# Patient Record
Sex: Female | Born: 1950 | Race: White | Hispanic: No | Smoking: Never smoker
Health system: Southern US, Community
[De-identification: ages and names within clinical notes are randomized; demographics above are authoritative.]

## PROBLEM LIST (undated history)

## (undated) DIAGNOSIS — E785 Hyperlipidemia, unspecified: Secondary | ICD-10-CM

## (undated) DIAGNOSIS — E0500 Thyrotoxicosis with diffuse goiter without thyrotoxic crisis or storm: Secondary | ICD-10-CM

## (undated) HISTORY — PX: HIP SURGERY: SHX245

## (undated) HISTORY — PX: BREAST EXCISIONAL BIOPSY: SUR124

## (undated) HISTORY — PX: BREAST BIOPSY: SHX20

## (undated) HISTORY — PX: SHOULDER SURGERY: SHX246

## (undated) HISTORY — DX: Hyperlipidemia, unspecified: E78.5

## (undated) HISTORY — PX: KNEE SURGERY: SHX244

---

## 2017-12-16 DIAGNOSIS — M9903 Segmental and somatic dysfunction of lumbar region: Secondary | ICD-10-CM | POA: Diagnosis not present

## 2017-12-16 DIAGNOSIS — M5136 Other intervertebral disc degeneration, lumbar region: Secondary | ICD-10-CM | POA: Diagnosis not present

## 2017-12-16 DIAGNOSIS — M9901 Segmental and somatic dysfunction of cervical region: Secondary | ICD-10-CM | POA: Diagnosis not present

## 2017-12-16 DIAGNOSIS — M9904 Segmental and somatic dysfunction of sacral region: Secondary | ICD-10-CM | POA: Diagnosis not present

## 2017-12-30 DIAGNOSIS — M9903 Segmental and somatic dysfunction of lumbar region: Secondary | ICD-10-CM | POA: Diagnosis not present

## 2017-12-30 DIAGNOSIS — M9901 Segmental and somatic dysfunction of cervical region: Secondary | ICD-10-CM | POA: Diagnosis not present

## 2017-12-30 DIAGNOSIS — M9904 Segmental and somatic dysfunction of sacral region: Secondary | ICD-10-CM | POA: Diagnosis not present

## 2017-12-30 DIAGNOSIS — M5136 Other intervertebral disc degeneration, lumbar region: Secondary | ICD-10-CM | POA: Diagnosis not present

## 2018-01-02 DIAGNOSIS — R079 Chest pain, unspecified: Secondary | ICD-10-CM | POA: Diagnosis not present

## 2018-01-02 DIAGNOSIS — F988 Other specified behavioral and emotional disorders with onset usually occurring in childhood and adolescence: Secondary | ICD-10-CM | POA: Diagnosis not present

## 2018-01-02 DIAGNOSIS — E039 Hypothyroidism, unspecified: Secondary | ICD-10-CM | POA: Diagnosis not present

## 2018-01-02 DIAGNOSIS — F419 Anxiety disorder, unspecified: Secondary | ICD-10-CM | POA: Diagnosis not present

## 2018-01-06 DIAGNOSIS — M5136 Other intervertebral disc degeneration, lumbar region: Secondary | ICD-10-CM | POA: Diagnosis not present

## 2018-01-06 DIAGNOSIS — M9904 Segmental and somatic dysfunction of sacral region: Secondary | ICD-10-CM | POA: Diagnosis not present

## 2018-01-06 DIAGNOSIS — M9901 Segmental and somatic dysfunction of cervical region: Secondary | ICD-10-CM | POA: Diagnosis not present

## 2018-01-06 DIAGNOSIS — M9903 Segmental and somatic dysfunction of lumbar region: Secondary | ICD-10-CM | POA: Diagnosis not present

## 2018-01-14 ENCOUNTER — Emergency Department (HOSPITAL_COMMUNITY)
Admission: EM | Admit: 2018-01-14 | Discharge: 2018-01-14 | Disposition: A | Discharge status: Home / Self Care | Payer: Medicare Other | Attending: Emergency Medicine | Admitting: Emergency Medicine

## 2018-01-14 ENCOUNTER — Encounter: Payer: Self-pay | Admitting: Emergency Medicine

## 2018-01-14 ENCOUNTER — Emergency Department (HOSPITAL_COMMUNITY): Payer: Medicare Other

## 2018-01-14 DIAGNOSIS — R079 Chest pain, unspecified: Secondary | ICD-10-CM | POA: Diagnosis not present

## 2018-01-14 DIAGNOSIS — R072 Precordial pain: Secondary | ICD-10-CM | POA: Diagnosis not present

## 2018-01-14 DIAGNOSIS — R0602 Shortness of breath: Secondary | ICD-10-CM | POA: Diagnosis not present

## 2018-01-14 HISTORY — DX: Thyrotoxicosis with diffuse goiter without thyrotoxic crisis or storm: E05.00

## 2018-01-14 LAB — COMPREHENSIVE METABOLIC PANEL
ALBUMIN: 3.9 g/dL (ref 3.5–5.0)
ALK PHOS: 53 U/L (ref 38–126)
ALT: 25 U/L (ref 0–44)
ANION GAP: 6 (ref 5–15)
AST: 28 U/L (ref 15–41)
BILIRUBIN TOTAL: 0.8 mg/dL (ref 0.3–1.2)
BUN: 10 mg/dL (ref 8–23)
CALCIUM: 9 mg/dL (ref 8.9–10.3)
CO2: 29 mmol/L (ref 22–32)
Chloride: 106 mmol/L (ref 98–111)
Creatinine, Ser: 0.89 mg/dL (ref 0.44–1.00)
GFR calc Af Amer: >60 mL/min (ref >60)
GFR calc non Af Amer: >60 mL/min (ref >60)
GLUCOSE: 101 mg/dL — AB (ref 70–99)
POTASSIUM: 3.9 mmol/L (ref 3.5–5.1)
SODIUM: 141 mmol/L (ref 135–145)
TOTAL PROTEIN: 6.7 g/dL (ref 6.5–8.1)

## 2018-01-14 LAB — CBC
HEMATOCRIT: 41.5 % (ref 36.0–46.0)
HEMOGLOBIN: 13.9 g/dL (ref 12.0–15.0)
MCH: 30.5 pg (ref 26.0–34.0)
MCHC: 33.5 g/dL (ref 30.0–36.0)
MCV: 91.2 fL (ref 78.0–100.0)
Platelets: 225 10*3/uL (ref 150–400)
RBC: 4.55 MIL/uL (ref 3.87–5.11)
RDW: 12.5 % (ref 11.5–15.5)
WBC: 3.9 10*3/uL — ABNORMAL LOW (ref 4.0–10.5)

## 2018-01-14 LAB — I-STAT TROPONIN, ED
TROPONIN I, POC: 0.01 ng/mL (ref 0.00–0.08)
TROPONIN I, POC: 0.01 ng/mL (ref 0.00–0.08)

## 2018-01-14 LAB — D-DIMER, QUANTITATIVE (NOT AT ARMC): D DIMER QUANT: 0.63 ug{FEU}/mL — AB (ref 0.00–0.50)

## 2018-01-14 MED ORDER — NITROGLYCERIN 0.4 MG SL SUBL
0.4000 mg | SUBLINGUAL_TABLET | SUBLINGUAL | Status: DC | PRN
  Filled 2018-01-14: qty 1

## 2018-01-14 MED ORDER — SODIUM CHLORIDE 0.9 % IV SOLN
INTRAVENOUS | Status: DC
  Administered 2018-01-14: 11:00:00 via INTRAVENOUS

## 2018-01-14 MED ORDER — ASPIRIN 81 MG PO CHEW
324.0000 mg | CHEWABLE_TABLET | Freq: Once | ORAL | Status: AC
  Administered 2018-01-14: 324 mg via ORAL
  Filled 2018-01-14: qty 4

## 2018-01-14 MED ORDER — ACETAMINOPHEN 325 MG PO TABS
650.0000 mg | ORAL_TABLET | Freq: Once | ORAL | Status: AC
  Administered 2018-01-14: 650 mg via ORAL
  Filled 2018-01-14: qty 2

## 2018-01-14 NOTE — ED Provider Notes (Signed)
[facility]-EMERGENCY DEPT Provider Note   CSN: [id number] Arrival date & time: 01/14/18  4098     History   Chief Complaint Chief Complaint  Patient presents with  . Chest Pain    HPI Lydia Osborne is a 67 y.o. female medical history of Graves' disease, who presents today for evaluation of chest pain and shortness of breath.  She reports that her symptoms have been intermittent for about the past month, however they have been getting closer and closer together and gradually worsening.  She denies any fevers or chills.  She reports that her chest pain is primarily in the lower substernal/left-sided chest.  She has not found anything that makes her pain better or worse, denies any coughs or fevers.  She denies diaphoresis, nausea or vomiting or abdominal pain.  No need to sleep on increased pillows or worsening dyspnea with exertion.  She reports that she had a stress test about 3-5 years ago, has never had a cardiac cath.   HPI  Past Medical History:  Diagnosis Date  . Graves' disease in remission     There are no active problems to display for this patient.   History reviewed. No pertinent surgical history.   OB History   None      Home Medications    Prior to Admission medications   Medication Sig Start Date End Date Taking? Authorizing Provider  levothyroxine (SYNTHROID, LEVOTHROID) 125 MCG tablet Take 125 mcg by mouth daily. 01/02/18  Yes [provider]  LORazepam (ATIVAN) 0.5 MG tablet Take 0.5 mg by mouth daily as needed for anxiety. 01/02/18  Yes [provider]    Family History History reviewed. No pertinent family history.  Social History Social History   Tobacco Use  . Smoking status: Never Smoker  . Smokeless tobacco: Never Used  Substance Use Topics  . Alcohol use: Not on file  . Drug use: Not on file     Allergies   Sulfa antibiotics   Review of Systems Review of Systems  Constitutional: Negative for  chills and fever.  Respiratory: Positive for shortness of breath. Negative for cough and chest tightness.   Cardiovascular: Positive for chest pain.  Gastrointestinal: Negative for abdominal pain, diarrhea, nausea and vomiting.  Neurological: Negative for dizziness.  All other systems reviewed and are negative.    Physical Exam Updated Vital Signs BP 119/68   Pulse (!) 53   Temp 98.5 F (36.9 C) (Oral)   Resp 19   SpO2 91%   Physical Exam  Constitutional: She is oriented to person, place, and time. She appears well-developed and well-nourished.  Non-toxic appearance. No distress.  HENT:  Head: Normocephalic and atraumatic.  Eyes: Conjunctivae are normal.  Neck: Normal range of motion. Neck supple.  Cardiovascular: Normal rate, regular rhythm and normal pulses.  No murmur heard. Pulses:      Dorsalis pedis pulses are 2+ on the right side, and 2+ on the left side.       Posterior tibial pulses are 2+ on the right side, and 2+ on the left side.  Pulmonary/Chest: Effort normal and breath sounds normal. No respiratory distress. She has no decreased breath sounds. She has no wheezes. She has no rales.  Unable to recreate chest pain with palpation.   Abdominal: Soft. Bowel sounds are normal. She exhibits no distension. There is no tenderness. There is no guarding.  Musculoskeletal: She exhibits no edema.       Right lower leg: Normal.  She exhibits no tenderness and no edema.       Left lower leg: Normal. She exhibits no tenderness and no edema.  Neurological: She is alert and oriented to person, place, and time.  Skin: Skin is warm and dry.  Psychiatric: She has a normal mood and affect.  Nursing note and vitals reviewed.    ED Treatments / Results  Labs (all labs ordered are listed, but only abnormal results are displayed) Labs Reviewed  COMPREHENSIVE METABOLIC PANEL - Abnormal; Notable for the following components:      Result Value   Glucose, Bld 101 (*)    All other  components within normal limits  CBC - Abnormal; Notable for the following components:   WBC 3.9 (*)    All other components within normal limits  D-DIMER, QUANTITATIVE (NOT [facility]) - Abnormal; Notable for the following components:   D-Dimer, Quant 0.63 (*)    All other components within normal limits  I-STAT TROPONIN, ED  I-STAT TROPONIN, ED    EKG EKG Interpretation  Date/Time:  Saturday January 14 2018 09:17:32 EDT Ventricular Rate:  69 PR Interval:    QRS Duration: 88 QT Interval:  367 QTC Calculation: 394 R Axis:   49 Text Interpretation:  Sinus rhythm Low voltage, precordial leads Borderline repolarization abnormality No old tracing to compare Confirmed by Linwood DibblesKnapp, Jon [phone]([id number]) on 01/14/2018 9:21:01 AM Also confirmed by Linwood DibblesKnapp, Jon [phone]), editor Barbette Hairassel, Kerry [phone])  on 01/14/2018 10:59:12 AM   Radiology Dg Chest 2 View  Result Date: 01/14/2018 CLINICAL DATA:  Chest pain and shortness of breath EXAM: CHEST - 2 VIEW COMPARISON:  None FINDINGS: Cardiac shadow is within normal limits. The lungs are well aerated bilaterally. A rounded density is noted over the right lung base. This is not well appreciated on the lateral projection. Noncontrast CT would be helpful further evaluation. No infiltrate or effusion is seen. No bony abnormality is noted. IMPRESSION: Vague nodular density as described on the right not well appreciated on the lateral projection. This may be related to the rib end. Nonemergent chest CT is recommended for further evaluation. Electronically Signed   By: Alcide CleverMark  Lukens M.D.   On: 01/14/2018 10:00    Procedures Procedures (including critical care time)  Medications Ordered in ED Medications  nitroGLYCERIN (NITROSTAT) SL tablet 0.4 mg (has no administration in time range)  0.9 %  sodium chloride infusion ( Intravenous New Bag/Given 01/14/18 1058)  aspirin chewable tablet 324 mg (324 mg Oral Given 01/14/18 0935)  acetaminophen (TYLENOL) tablet 650 mg (650 mg Oral  Given 01/14/18 1059)     Initial Impression / Assessment and Plan / ED Course  I have reviewed the triage vital signs and the nursing notes.  Pertinent labs & imaging results that were available during my care of the patient were reviewed by me and considered in my medical decision making (see chart for details).  Clinical Course as of Jan 15 1544  Sat Jan 14, 2018  1030 Reevaluated, chest pain remains a 4 out of 10.  Will give sublingual nitro.   [EH]  1138 When adjusted for age is normal.   D-Dimer, Quant(!): 0.63 [EH]  [id number] Spoke with cardiology who feels can be discharged.    [EH]    Clinical Course User Index [EH] Cristina GongHammond, Peyton Rossner W, PA-C   Patient is to be discharged with recommendation to follow up with PCP in regards to today's hospital visit.  Patient's heart score is 3 making her low risk.  EKG and troponin x2 without evidence of ACS.  Chest x-ray does not show any abnormalities or consolidation.  Patient's chest pain resolved in the emergency room without specific treatment.  After d-dimer was mildly elevated at 0.63, however when adjusted for age this is not elevated.  Heart with regular rate rhythm, breath sounds equal bilaterally.  He did touch base with cardiology as patient's history sounds concerning for an unstable anginal type picture, they recommended outpatient follow-up and states that they will get their office to contact patient for sooner appointment.  This patient was seen as a shared visit with Dr. Lynelle DoctorKnapp.  At the time of discharge patient was symptom free.   Return precautions were discussed with patient who states their understanding.  At the time of discharge patient denied any unaddressed complaints or concerns.  Patient is agreeable for discharge home.   Final Clinical Impressions(s) / ED Diagnoses   Final diagnoses:  Chest pain, unspecified type    ED Discharge Orders    None       Norman ClayHammond, Yailene Badia W, PA-C 01/14/18 1545    Linwood DibblesKnapp, Jon,  MD 01/15/18 [phone]

## 2018-01-14 NOTE — ED Provider Notes (Signed)
Medical screening examination/treatment/procedure(s) were conducted as a shared visit with non-physician practitioner(s) and myself.  I personally evaluated the patient during the encounter.  EKG Interpretation  Date/Time:  Saturday January 14 2018 09:17:32 EDT Ventricular Rate:  69 PR Interval:    QRS Duration: 88 QT Interval:  367 QTC Calculation: 394 R Axis:   49 Text Interpretation:  Sinus rhythm Low voltage, precordial leads Borderline repolarization abnormality No old tracing to compare Confirmed by Linwood DibblesKnapp, Jaevian Shean [phone]) on 01/14/2018 9:21:01 AM Also confirmed by Linwood DibblesKnapp, Norleen Xie [phone]), editor Barbette Hairassel, Kerry [phone])  on 01/14/2018 10:59:12 AM   Patient presented to the emergency room for evaluation of chest pain.  Patient states she has been having symptoms off and on for a few weeks.  She has an outpatient referral to a cardiologist.  Patient states that the discomfort in the center of her chest that radiates to her back.  She states she is very active and has not had any chest pain associated with exertion or activity.  The symptoms just seem to occur randomly.  She is not having any leg swelling.  She denies any nausea vomiting or esophageal reflux symptoms.  No definitive abnormalities on EKG.  Labs are normal.  Plan on delta troponin will add on a d-dimer.   Heart score of 3.   Linwood DibblesKnapp, Shandy Checo, MD 01/14/18 1113

## 2018-01-14 NOTE — ED Notes (Signed)
3x Nitro Sublingual tablets placed at patient bedside for chest pain.

## 2018-01-14 NOTE — ED Triage Notes (Signed)
Pt complains of chest pain and shortness of breath since last night. Pt saw PCP for chest pain last week, had EKG performed and was referred to cardiologist, which she sees next month. Pt denies cough, states pain is worse when lying down.

## 2018-01-14 NOTE — ED Notes (Signed)
ED Provider at bedside. 

## 2018-01-14 NOTE — Discharge Instructions (Addendum)
If your chest pain returns, you develop shortness of breath, or any new or concerning symptoms please seek additional medical care and evaluation.

## 2018-01-18 DIAGNOSIS — M9904 Segmental and somatic dysfunction of sacral region: Secondary | ICD-10-CM | POA: Diagnosis not present

## 2018-01-18 DIAGNOSIS — M5136 Other intervertebral disc degeneration, lumbar region: Secondary | ICD-10-CM | POA: Diagnosis not present

## 2018-01-18 DIAGNOSIS — M9903 Segmental and somatic dysfunction of lumbar region: Secondary | ICD-10-CM | POA: Diagnosis not present

## 2018-01-18 DIAGNOSIS — M9901 Segmental and somatic dysfunction of cervical region: Secondary | ICD-10-CM | POA: Diagnosis not present

## 2018-01-20 ENCOUNTER — Ambulatory Visit: Payer: Self-pay | Admitting: Cardiovascular Disease

## 2018-02-06 DIAGNOSIS — M9903 Segmental and somatic dysfunction of lumbar region: Secondary | ICD-10-CM | POA: Diagnosis not present

## 2018-02-06 DIAGNOSIS — M9901 Segmental and somatic dysfunction of cervical region: Secondary | ICD-10-CM | POA: Diagnosis not present

## 2018-02-06 DIAGNOSIS — M9904 Segmental and somatic dysfunction of sacral region: Secondary | ICD-10-CM | POA: Diagnosis not present

## 2018-02-06 DIAGNOSIS — M5136 Other intervertebral disc degeneration, lumbar region: Secondary | ICD-10-CM | POA: Diagnosis not present

## 2018-02-13 DIAGNOSIS — M9903 Segmental and somatic dysfunction of lumbar region: Secondary | ICD-10-CM | POA: Diagnosis not present

## 2018-02-13 DIAGNOSIS — M9904 Segmental and somatic dysfunction of sacral region: Secondary | ICD-10-CM | POA: Diagnosis not present

## 2018-02-13 DIAGNOSIS — M5136 Other intervertebral disc degeneration, lumbar region: Secondary | ICD-10-CM | POA: Diagnosis not present

## 2018-02-13 DIAGNOSIS — M9901 Segmental and somatic dysfunction of cervical region: Secondary | ICD-10-CM | POA: Diagnosis not present

## 2018-02-14 ENCOUNTER — Ambulatory Visit: Payer: Medicare Other | Admitting: Cardiovascular Disease

## 2018-02-14 ENCOUNTER — Encounter: Payer: Self-pay | Admitting: Cardiovascular Disease

## 2018-02-14 DIAGNOSIS — R079 Chest pain, unspecified: Secondary | ICD-10-CM | POA: Diagnosis not present

## 2018-02-14 DIAGNOSIS — R0789 Other chest pain: Secondary | ICD-10-CM | POA: Diagnosis not present

## 2018-02-14 DIAGNOSIS — E785 Hyperlipidemia, unspecified: Secondary | ICD-10-CM | POA: Insufficient documentation

## 2018-02-14 DIAGNOSIS — E7800 Pure hypercholesterolemia, unspecified: Secondary | ICD-10-CM

## 2018-02-14 NOTE — Progress Notes (Signed)
02/14/2018 Lydia Osborne   1950/06/07  [id number]  Primary Physician Koirala, Dibas, MD Primary Cardiologist: Runell Gess MD Lydia Osborne  HPI:  Lydia Osborne is a 67 y.o. moderately overweight married Caucasian female mother of 2, grandmother of 2 grandchildren referred by Dr. Lynelle Doctor in the emergency room for cardiovascular evaluation because of chest pain.  She is retired from working in a group home.  Her risk factors include untreated hyperlipidemia attempting to improve with dietary modification and family history with father who had a myocardial infarction in his 28s.  She is never had a heart attack or stroke.  There is no history of GERD.  She had chest pain the last several months occurring several times a month lasting hours at a time.  Typically epigastric rating to her back without associated symptoms.  Is not brought on by exertion, activity or food.  She was seen in the emergency room on 01/14/2018 with the symptoms ruled out for myocardial infarction.   Current Meds  Medication Sig  . levothyroxine (SYNTHROID, LEVOTHROID) 125 MCG tablet Take 125 mcg by mouth daily.  Marland Kitchen LORazepam (ATIVAN) 0.5 MG tablet Take 0.5 mg by mouth daily as needed for anxiety.     Allergies  Allergen Reactions  . Sulfa Antibiotics     Extended exposure causes itchiness    Social History   Socioeconomic History  . Marital status: Married    Spouse name: Not on file  . Number of children: Not on file  . Years of education: Not on file  . Highest education level: Not on file  Occupational History  . Not on file  Social Needs  . Financial resource strain: Not on file  . Food insecurity:    Worry: Not on file    Inability: Not on file  . Transportation needs:    Medical: Not on file    Non-medical: Not on file  Tobacco Use  . Smoking status: Never Smoker  . Smokeless tobacco: Never Used  Substance and Sexual Activity  . Alcohol use: Not on file  . Drug use: Not on  file  . Sexual activity: Not on file  Lifestyle  . Physical activity:    Days per week: Not on file    Minutes per session: Not on file  . Stress: Not on file  Relationships  . Social connections:    Talks on phone: Not on file    Gets together: Not on file    Attends religious service: Not on file    Active member of club or organization: Not on file    Attends meetings of clubs or organizations: Not on file    Relationship status: Not on file  . Intimate partner violence:    Fear of current or ex partner: Not on file    Emotionally abused: Not on file    Physically abused: Not on file    Forced sexual activity: Not on file  Other Topics Concern  . Not on file  Social History Narrative  . Not on file     Review of Systems: General: negative for chills, fever, night sweats or weight changes.  Cardiovascular: negative for chest pain, dyspnea on exertion, edema, orthopnea, palpitations, paroxysmal nocturnal dyspnea or shortness of breath Dermatological: negative for rash Respiratory: negative for cough or wheezing Urologic: negative for hematuria Abdominal: negative for nausea, vomiting, diarrhea, bright red blood per rectum, melena, or hematemesis Neurologic: negative for visual changes, syncope, or dizziness  All other systems reviewed and are otherwise negative except as noted above.    Blood pressure 110/76, pulse 72, height 5\' 7"  (1.702 m), weight 202 lb (91.6 kg).  General appearance: alert and no distress Neck: no adenopathy, no carotid bruit, no JVD, supple, symmetrical, trachea midline and thyroid not enlarged, symmetric, no tenderness/mass/nodules Lungs: clear to auscultation bilaterally Heart: regular rate and rhythm, S1, S2 normal, no murmur, click, rub or gallop Extremities: extremities normal, atraumatic, no cyanosis or edema Pulses: 2+ and symmetric Skin: Skin color, texture, turgor normal. No rashes or lesions Neurologic: Alert and oriented X 3, normal  strength and tone. Normal symmetric reflexes. Normal coordination and gait  EKG not performed today  ASSESSMENT AND PLAN:   Atypical chest pain Ms. Priore was sent to me by Dr. Iantha FallenJohn Knapp in the emergency room for evaluation of new onset chest pain.  Risk factors include untreated hyperlipidemia and family history with a father who had a microinfarction in his 7760s.  She is never had a heart attack or stroke.  Her chest pain began several months ago and have occurred several times a month lasting up to hours at a time.  Her pain is epigastric rating to her back.  Is not brought on by food or activity.  She was seen in the emergency room 01/14/2018 and ruled out for myocardial infarction.  She has no history of GERD.  Hyperlipidemia History of hyperlipidemia with lipid profile performed 01/02/2018 revealing total cholesterol 246, LDL 151.  She is attempting to improve this with dietary modification followed by her PCP, Dr. Docia Chuck"Koirala       Runell GessJonathan J. Masha Orbach MD [facility] CenterFACP,FACC,FAHA, [facility]SC[facility] 02/14/2018 10:50 AM

## 2018-02-14 NOTE — Addendum Note (Signed)
Addended by: Jarvis NewcomerPARRIS-GODLEY, Fain Francis S on: 02/14/2018 11:03 AM   Modules accepted: Orders

## 2018-02-14 NOTE — Assessment & Plan Note (Signed)
History of hyperlipidemia with lipid profile performed 01/02/2018 revealing total cholesterol 246, LDL 151.  She is attempting to improve this with dietary modification followed by her PCP, Dr. Docia Chuck"Koirala

## 2018-02-14 NOTE — Patient Instructions (Signed)
Medication Instructions:  Your physician recommends that you continue on your current medications as directed. Please refer to the Current Medication list given to you today.   Labwork: None orderer  Testing/Procedures: Your physician has requested that you have an exercise tolerance test. For further information please visit [email or web address]. Please also follow instruction sheet, as given.   Follow-Up: Your physician wants you to follow-up in: 6 months with Dr.Berry You will receive a reminder letter in the mail two months in advance. If you don't receive a letter, please call our office to schedule the follow-up appointment.   Any Other Special Instructions Will Be Listed Below (If Applicable).     If you need a refill on your cardiac medications before your next appointment, please call your pharmacy.

## 2018-02-14 NOTE — Assessment & Plan Note (Signed)
Ms. Lydia Osborne was sent to me by Dr. Iantha FallenJohn Knapp in the emergency room for evaluation of new onset chest pain.  Risk factors include untreated hyperlipidemia and family history with a father who had a microinfarction in his 460s.  She is never had a heart attack or stroke.  Her chest pain began several months ago and have occurred several times a month lasting up to hours at a time.  Her pain is epigastric rating to her back.  Is not brought on by food or activity.  She was seen in the emergency room 01/14/2018 and ruled out for myocardial infarction.  She has no history of GERD.

## 2018-02-15 DIAGNOSIS — H2513 Age-related nuclear cataract, bilateral: Secondary | ICD-10-CM | POA: Diagnosis not present

## 2018-02-15 DIAGNOSIS — H25013 Cortical age-related cataract, bilateral: Secondary | ICD-10-CM | POA: Diagnosis not present

## 2018-02-15 DIAGNOSIS — H43813 Vitreous degeneration, bilateral: Secondary | ICD-10-CM | POA: Diagnosis not present

## 2018-02-15 DIAGNOSIS — H35363 Drusen (degenerative) of macula, bilateral: Secondary | ICD-10-CM | POA: Diagnosis not present

## 2018-02-16 ENCOUNTER — Telehealth (HOSPITAL_COMMUNITY): Payer: Self-pay

## 2018-02-16 NOTE — Telephone Encounter (Signed)
Encounter complete. 

## 2018-02-17 ENCOUNTER — Encounter (HOSPITAL_COMMUNITY): Payer: Self-pay

## 2018-02-20 DIAGNOSIS — M5136 Other intervertebral disc degeneration, lumbar region: Secondary | ICD-10-CM | POA: Diagnosis not present

## 2018-02-20 DIAGNOSIS — M9901 Segmental and somatic dysfunction of cervical region: Secondary | ICD-10-CM | POA: Diagnosis not present

## 2018-02-20 DIAGNOSIS — M9904 Segmental and somatic dysfunction of sacral region: Secondary | ICD-10-CM | POA: Diagnosis not present

## 2018-02-20 DIAGNOSIS — M9903 Segmental and somatic dysfunction of lumbar region: Secondary | ICD-10-CM | POA: Diagnosis not present

## 2018-02-21 ENCOUNTER — Ambulatory Visit (HOSPITAL_COMMUNITY)
Admission: RE | Admit: 2018-02-21 | Discharge: 2018-02-21 | Disposition: A | Discharge status: Home / Self Care | Payer: Medicare Other | Source: Ambulatory Visit | Attending: Internal Medicine | Admitting: Internal Medicine

## 2018-02-21 DIAGNOSIS — R0789 Other chest pain: Secondary | ICD-10-CM | POA: Diagnosis not present

## 2018-02-21 LAB — MYOCARDIAL PERFUSION IMAGING
CHL CUP MPHR: 155 {beats}/min
CHL RATE OF PERCEIVED EXERTION: 19
CSEPEDS: 11 s
Estimated workload: 9.6 METS
Exercise duration (min): 8 min
LV dias vol: 67 mL (ref 46–106)
LVSYSVOL: 20 mL
NUC STRESS TID: 1.08
Peak HR: 141 {beats}/min
Percent HR: 90 %
Rest HR: 58 {beats}/min
SDS: 2
SRS: 2
SSS: 4

## 2018-02-21 MED ORDER — TECHNETIUM TC 99M TETROFOSMIN IV KIT
32.0000 | PACK | Freq: Once | INTRAVENOUS | Status: AC | PRN
  Administered 2018-02-21: 32 via INTRAVENOUS
  Filled 2018-02-21: qty 32

## 2018-02-21 MED ORDER — TECHNETIUM TC 99M TETROFOSMIN IV KIT
10.0000 | PACK | Freq: Once | INTRAVENOUS | Status: AC | PRN
  Administered 2018-02-21: 10 via INTRAVENOUS
  Filled 2018-02-21: qty 10

## 2018-03-01 DIAGNOSIS — M9904 Segmental and somatic dysfunction of sacral region: Secondary | ICD-10-CM | POA: Diagnosis not present

## 2018-03-01 DIAGNOSIS — M9903 Segmental and somatic dysfunction of lumbar region: Secondary | ICD-10-CM | POA: Diagnosis not present

## 2018-03-01 DIAGNOSIS — M5136 Other intervertebral disc degeneration, lumbar region: Secondary | ICD-10-CM | POA: Diagnosis not present

## 2018-03-01 DIAGNOSIS — M9901 Segmental and somatic dysfunction of cervical region: Secondary | ICD-10-CM | POA: Diagnosis not present

## 2018-03-06 ENCOUNTER — Ambulatory Visit (INDEPENDENT_AMBULATORY_CARE_PROVIDER_SITE_OTHER): Payer: No Typology Code available for payment source | Admitting: Psychology

## 2018-03-06 DIAGNOSIS — F331 Major depressive disorder, recurrent, moderate: Secondary | ICD-10-CM | POA: Diagnosis not present

## 2018-03-10 DIAGNOSIS — F321 Major depressive disorder, single episode, moderate: Secondary | ICD-10-CM | POA: Diagnosis not present

## 2018-03-10 DIAGNOSIS — D72819 Decreased white blood cell count, unspecified: Secondary | ICD-10-CM | POA: Diagnosis not present

## 2018-03-20 DIAGNOSIS — M9904 Segmental and somatic dysfunction of sacral region: Secondary | ICD-10-CM | POA: Diagnosis not present

## 2018-03-20 DIAGNOSIS — M9901 Segmental and somatic dysfunction of cervical region: Secondary | ICD-10-CM | POA: Diagnosis not present

## 2018-03-20 DIAGNOSIS — M5136 Other intervertebral disc degeneration, lumbar region: Secondary | ICD-10-CM | POA: Diagnosis not present

## 2018-03-20 DIAGNOSIS — M9903 Segmental and somatic dysfunction of lumbar region: Secondary | ICD-10-CM | POA: Diagnosis not present

## 2018-03-21 ENCOUNTER — Ambulatory Visit (INDEPENDENT_AMBULATORY_CARE_PROVIDER_SITE_OTHER): Payer: No Typology Code available for payment source | Admitting: Psychology

## 2018-03-21 DIAGNOSIS — F331 Major depressive disorder, recurrent, moderate: Secondary | ICD-10-CM

## 2018-03-22 DIAGNOSIS — D225 Melanocytic nevi of trunk: Secondary | ICD-10-CM | POA: Diagnosis not present

## 2018-03-22 DIAGNOSIS — L821 Other seborrheic keratosis: Secondary | ICD-10-CM | POA: Diagnosis not present

## 2018-03-22 DIAGNOSIS — R202 Paresthesia of skin: Secondary | ICD-10-CM | POA: Diagnosis not present

## 2018-03-22 DIAGNOSIS — Z85828 Personal history of other malignant neoplasm of skin: Secondary | ICD-10-CM | POA: Diagnosis not present

## 2018-04-03 ENCOUNTER — Ambulatory Visit (INDEPENDENT_AMBULATORY_CARE_PROVIDER_SITE_OTHER): Payer: No Typology Code available for payment source | Admitting: Psychology

## 2018-04-03 DIAGNOSIS — F331 Major depressive disorder, recurrent, moderate: Secondary | ICD-10-CM | POA: Diagnosis not present

## 2018-04-04 DIAGNOSIS — M9903 Segmental and somatic dysfunction of lumbar region: Secondary | ICD-10-CM | POA: Diagnosis not present

## 2018-04-04 DIAGNOSIS — M9904 Segmental and somatic dysfunction of sacral region: Secondary | ICD-10-CM | POA: Diagnosis not present

## 2018-04-04 DIAGNOSIS — M5136 Other intervertebral disc degeneration, lumbar region: Secondary | ICD-10-CM | POA: Diagnosis not present

## 2018-04-04 DIAGNOSIS — M9901 Segmental and somatic dysfunction of cervical region: Secondary | ICD-10-CM | POA: Diagnosis not present

## 2018-04-13 ENCOUNTER — Ambulatory Visit (INDEPENDENT_AMBULATORY_CARE_PROVIDER_SITE_OTHER): Payer: No Typology Code available for payment source | Admitting: Psychology

## 2018-04-13 DIAGNOSIS — F331 Major depressive disorder, recurrent, moderate: Secondary | ICD-10-CM | POA: Diagnosis not present

## 2018-04-25 DIAGNOSIS — M9901 Segmental and somatic dysfunction of cervical region: Secondary | ICD-10-CM | POA: Diagnosis not present

## 2018-04-25 DIAGNOSIS — M5136 Other intervertebral disc degeneration, lumbar region: Secondary | ICD-10-CM | POA: Diagnosis not present

## 2018-04-25 DIAGNOSIS — M9903 Segmental and somatic dysfunction of lumbar region: Secondary | ICD-10-CM | POA: Diagnosis not present

## 2018-04-25 DIAGNOSIS — M9904 Segmental and somatic dysfunction of sacral region: Secondary | ICD-10-CM | POA: Diagnosis not present

## 2018-05-04 ENCOUNTER — Ambulatory Visit (INDEPENDENT_AMBULATORY_CARE_PROVIDER_SITE_OTHER): Payer: No Typology Code available for payment source | Admitting: Psychology

## 2018-05-04 DIAGNOSIS — F331 Major depressive disorder, recurrent, moderate: Secondary | ICD-10-CM | POA: Diagnosis not present

## 2018-05-09 DIAGNOSIS — M9904 Segmental and somatic dysfunction of sacral region: Secondary | ICD-10-CM | POA: Diagnosis not present

## 2018-05-09 DIAGNOSIS — M5136 Other intervertebral disc degeneration, lumbar region: Secondary | ICD-10-CM | POA: Diagnosis not present

## 2018-05-09 DIAGNOSIS — M9901 Segmental and somatic dysfunction of cervical region: Secondary | ICD-10-CM | POA: Diagnosis not present

## 2018-05-09 DIAGNOSIS — M9903 Segmental and somatic dysfunction of lumbar region: Secondary | ICD-10-CM | POA: Diagnosis not present

## 2018-05-15 DIAGNOSIS — F321 Major depressive disorder, single episode, moderate: Secondary | ICD-10-CM | POA: Diagnosis not present

## 2018-05-15 DIAGNOSIS — F419 Anxiety disorder, unspecified: Secondary | ICD-10-CM | POA: Diagnosis not present

## 2018-05-18 ENCOUNTER — Ambulatory Visit (INDEPENDENT_AMBULATORY_CARE_PROVIDER_SITE_OTHER): Payer: No Typology Code available for payment source | Admitting: Psychology

## 2018-05-18 DIAGNOSIS — F331 Major depressive disorder, recurrent, moderate: Secondary | ICD-10-CM

## 2018-06-06 ENCOUNTER — Ambulatory Visit (INDEPENDENT_AMBULATORY_CARE_PROVIDER_SITE_OTHER): Payer: Medicare Other | Admitting: Psychology

## 2018-06-06 DIAGNOSIS — F331 Major depressive disorder, recurrent, moderate: Secondary | ICD-10-CM

## 2018-06-19 ENCOUNTER — Ambulatory Visit: Payer: No Typology Code available for payment source | Admitting: Psychology

## 2018-07-04 ENCOUNTER — Ambulatory Visit (INDEPENDENT_AMBULATORY_CARE_PROVIDER_SITE_OTHER): Payer: No Typology Code available for payment source | Admitting: Psychology

## 2018-07-04 DIAGNOSIS — F331 Major depressive disorder, recurrent, moderate: Secondary | ICD-10-CM

## 2018-07-18 ENCOUNTER — Ambulatory Visit (INDEPENDENT_AMBULATORY_CARE_PROVIDER_SITE_OTHER): Payer: No Typology Code available for payment source | Admitting: Psychology

## 2018-07-18 DIAGNOSIS — F331 Major depressive disorder, recurrent, moderate: Secondary | ICD-10-CM

## 2018-08-01 ENCOUNTER — Ambulatory Visit: Payer: No Typology Code available for payment source | Admitting: Psychology

## 2018-08-08 DIAGNOSIS — M9901 Segmental and somatic dysfunction of cervical region: Secondary | ICD-10-CM | POA: Diagnosis not present

## 2018-08-08 DIAGNOSIS — M5136 Other intervertebral disc degeneration, lumbar region: Secondary | ICD-10-CM | POA: Diagnosis not present

## 2018-08-08 DIAGNOSIS — M9904 Segmental and somatic dysfunction of sacral region: Secondary | ICD-10-CM | POA: Diagnosis not present

## 2018-08-08 DIAGNOSIS — M9903 Segmental and somatic dysfunction of lumbar region: Secondary | ICD-10-CM | POA: Diagnosis not present

## 2018-08-11 DIAGNOSIS — M9903 Segmental and somatic dysfunction of lumbar region: Secondary | ICD-10-CM | POA: Diagnosis not present

## 2018-08-11 DIAGNOSIS — M9901 Segmental and somatic dysfunction of cervical region: Secondary | ICD-10-CM | POA: Diagnosis not present

## 2018-08-11 DIAGNOSIS — M5136 Other intervertebral disc degeneration, lumbar region: Secondary | ICD-10-CM | POA: Diagnosis not present

## 2018-08-11 DIAGNOSIS — M9904 Segmental and somatic dysfunction of sacral region: Secondary | ICD-10-CM | POA: Diagnosis not present

## 2018-08-15 ENCOUNTER — Ambulatory Visit (INDEPENDENT_AMBULATORY_CARE_PROVIDER_SITE_OTHER): Payer: Medicare Other | Admitting: Psychology

## 2018-08-15 DIAGNOSIS — F331 Major depressive disorder, recurrent, moderate: Secondary | ICD-10-CM

## 2018-08-21 DIAGNOSIS — M9903 Segmental and somatic dysfunction of lumbar region: Secondary | ICD-10-CM | POA: Diagnosis not present

## 2018-08-21 DIAGNOSIS — M9904 Segmental and somatic dysfunction of sacral region: Secondary | ICD-10-CM | POA: Diagnosis not present

## 2018-08-21 DIAGNOSIS — M9901 Segmental and somatic dysfunction of cervical region: Secondary | ICD-10-CM | POA: Diagnosis not present

## 2018-08-21 DIAGNOSIS — M5136 Other intervertebral disc degeneration, lumbar region: Secondary | ICD-10-CM | POA: Diagnosis not present

## 2018-08-25 DIAGNOSIS — M9903 Segmental and somatic dysfunction of lumbar region: Secondary | ICD-10-CM | POA: Diagnosis not present

## 2018-08-25 DIAGNOSIS — M9904 Segmental and somatic dysfunction of sacral region: Secondary | ICD-10-CM | POA: Diagnosis not present

## 2018-08-25 DIAGNOSIS — M9901 Segmental and somatic dysfunction of cervical region: Secondary | ICD-10-CM | POA: Diagnosis not present

## 2018-08-25 DIAGNOSIS — M5136 Other intervertebral disc degeneration, lumbar region: Secondary | ICD-10-CM | POA: Diagnosis not present

## 2018-08-29 ENCOUNTER — Ambulatory Visit: Payer: No Typology Code available for payment source | Admitting: Psychology

## 2018-09-01 DIAGNOSIS — M5136 Other intervertebral disc degeneration, lumbar region: Secondary | ICD-10-CM | POA: Diagnosis not present

## 2018-09-01 DIAGNOSIS — M9901 Segmental and somatic dysfunction of cervical region: Secondary | ICD-10-CM | POA: Diagnosis not present

## 2018-09-01 DIAGNOSIS — M9903 Segmental and somatic dysfunction of lumbar region: Secondary | ICD-10-CM | POA: Diagnosis not present

## 2018-09-01 DIAGNOSIS — M9904 Segmental and somatic dysfunction of sacral region: Secondary | ICD-10-CM | POA: Diagnosis not present

## 2018-09-08 ENCOUNTER — Telehealth: Payer: Self-pay | Admitting: Cardiovascular Disease

## 2018-09-08 DIAGNOSIS — M9903 Segmental and somatic dysfunction of lumbar region: Secondary | ICD-10-CM | POA: Diagnosis not present

## 2018-09-08 DIAGNOSIS — M9904 Segmental and somatic dysfunction of sacral region: Secondary | ICD-10-CM | POA: Diagnosis not present

## 2018-09-08 DIAGNOSIS — M9901 Segmental and somatic dysfunction of cervical region: Secondary | ICD-10-CM | POA: Diagnosis not present

## 2018-09-08 DIAGNOSIS — M5136 Other intervertebral disc degeneration, lumbar region: Secondary | ICD-10-CM | POA: Diagnosis not present

## 2018-09-08 NOTE — Telephone Encounter (Signed)
Called pt, no answer. Left msg to call back

## 2018-09-08 NOTE — Telephone Encounter (Signed)
  Patient had an episode last night of tachycardia with a little bit of shortness of breath. Her heart rate went between 120-90 for about 20 minutes last night while she was sitting still on the couch. Please call to discuss.

## 2018-09-11 NOTE — Telephone Encounter (Signed)
[id number] 

## 2018-09-12 DIAGNOSIS — E039 Hypothyroidism, unspecified: Secondary | ICD-10-CM | POA: Diagnosis not present

## 2018-09-12 DIAGNOSIS — R002 Palpitations: Secondary | ICD-10-CM | POA: Diagnosis not present

## 2018-09-12 NOTE — Telephone Encounter (Signed)
Spoke with Lydia Osborne and Lydia Osborne had the one episode of fast heart rate and SOB and  was given an appt in August not sure Lydia Osborne wanted a virtual appt sooner .Per Lydia Osborne is on way to PCP will call back if deems necessary Will forward to Dr Allyson Sabal for review and any recommendations ./cy

## 2018-09-26 ENCOUNTER — Ambulatory Visit: Payer: No Typology Code available for payment source | Admitting: Psychology

## 2018-10-04 ENCOUNTER — Other Ambulatory Visit: Payer: Self-pay | Admitting: Family Medicine

## 2018-10-04 DIAGNOSIS — R911 Solitary pulmonary nodule: Secondary | ICD-10-CM

## 2018-10-09 ENCOUNTER — Ambulatory Visit
Admission: RE | Admit: 2018-10-09 | Discharge: 2018-10-09 | Disposition: A | Discharge status: Home / Self Care | Payer: Medicare Other | Source: Ambulatory Visit | Attending: Family Medicine | Admitting: Family Medicine

## 2018-10-09 DIAGNOSIS — R911 Solitary pulmonary nodule: Secondary | ICD-10-CM | POA: Diagnosis not present

## 2018-10-09 DIAGNOSIS — Z113 Encounter for screening for infections with a predominantly sexual mode of transmission: Secondary | ICD-10-CM | POA: Diagnosis not present

## 2018-10-10 ENCOUNTER — Ambulatory Visit: Payer: Medicare Other | Admitting: Psychology

## 2018-10-20 DIAGNOSIS — Z6833 Body mass index (BMI) 33.0-33.9, adult: Secondary | ICD-10-CM | POA: Diagnosis not present

## 2018-10-20 DIAGNOSIS — Z0000 Encounter for general adult medical examination without abnormal findings: Secondary | ICD-10-CM | POA: Diagnosis not present

## 2018-10-20 DIAGNOSIS — E039 Hypothyroidism, unspecified: Secondary | ICD-10-CM | POA: Diagnosis not present

## 2018-10-20 DIAGNOSIS — Z79899 Other long term (current) drug therapy: Secondary | ICD-10-CM | POA: Diagnosis not present

## 2018-10-24 ENCOUNTER — Ambulatory Visit: Payer: Medicare Other | Admitting: Psychology

## 2018-10-24 DIAGNOSIS — F419 Anxiety disorder, unspecified: Secondary | ICD-10-CM | POA: Diagnosis not present

## 2018-10-24 DIAGNOSIS — F321 Major depressive disorder, single episode, moderate: Secondary | ICD-10-CM | POA: Diagnosis not present

## 2018-10-24 DIAGNOSIS — Z0001 Encounter for general adult medical examination with abnormal findings: Secondary | ICD-10-CM | POA: Diagnosis not present

## 2018-10-24 DIAGNOSIS — E039 Hypothyroidism, unspecified: Secondary | ICD-10-CM | POA: Diagnosis not present

## 2018-10-30 DIAGNOSIS — M9901 Segmental and somatic dysfunction of cervical region: Secondary | ICD-10-CM | POA: Diagnosis not present

## 2018-10-30 DIAGNOSIS — M9903 Segmental and somatic dysfunction of lumbar region: Secondary | ICD-10-CM | POA: Diagnosis not present

## 2018-10-30 DIAGNOSIS — M5136 Other intervertebral disc degeneration, lumbar region: Secondary | ICD-10-CM | POA: Diagnosis not present

## 2018-10-30 DIAGNOSIS — M9904 Segmental and somatic dysfunction of sacral region: Secondary | ICD-10-CM | POA: Diagnosis not present

## 2018-11-06 DIAGNOSIS — M9904 Segmental and somatic dysfunction of sacral region: Secondary | ICD-10-CM | POA: Diagnosis not present

## 2018-11-06 DIAGNOSIS — M9903 Segmental and somatic dysfunction of lumbar region: Secondary | ICD-10-CM | POA: Diagnosis not present

## 2018-11-06 DIAGNOSIS — M9901 Segmental and somatic dysfunction of cervical region: Secondary | ICD-10-CM | POA: Diagnosis not present

## 2018-11-06 DIAGNOSIS — M5136 Other intervertebral disc degeneration, lumbar region: Secondary | ICD-10-CM | POA: Diagnosis not present

## 2018-11-07 ENCOUNTER — Ambulatory Visit: Payer: Medicare Other | Admitting: Psychology

## 2018-11-07 DIAGNOSIS — Z0120 Encounter for dental examination and cleaning without abnormal findings: Secondary | ICD-10-CM | POA: Diagnosis not present

## 2018-11-09 ENCOUNTER — Other Ambulatory Visit: Payer: Self-pay | Admitting: Family Medicine

## 2018-11-09 DIAGNOSIS — Z1231 Encounter for screening mammogram for malignant neoplasm of breast: Secondary | ICD-10-CM

## 2018-11-09 DIAGNOSIS — E2839 Other primary ovarian failure: Secondary | ICD-10-CM

## 2018-11-13 DIAGNOSIS — M9901 Segmental and somatic dysfunction of cervical region: Secondary | ICD-10-CM | POA: Diagnosis not present

## 2018-11-13 DIAGNOSIS — M5136 Other intervertebral disc degeneration, lumbar region: Secondary | ICD-10-CM | POA: Diagnosis not present

## 2018-11-13 DIAGNOSIS — M9903 Segmental and somatic dysfunction of lumbar region: Secondary | ICD-10-CM | POA: Diagnosis not present

## 2018-11-13 DIAGNOSIS — M9904 Segmental and somatic dysfunction of sacral region: Secondary | ICD-10-CM | POA: Diagnosis not present

## 2018-11-20 DIAGNOSIS — M9903 Segmental and somatic dysfunction of lumbar region: Secondary | ICD-10-CM | POA: Diagnosis not present

## 2018-11-20 DIAGNOSIS — M5136 Other intervertebral disc degeneration, lumbar region: Secondary | ICD-10-CM | POA: Diagnosis not present

## 2018-11-20 DIAGNOSIS — M9901 Segmental and somatic dysfunction of cervical region: Secondary | ICD-10-CM | POA: Diagnosis not present

## 2018-11-20 DIAGNOSIS — M9904 Segmental and somatic dysfunction of sacral region: Secondary | ICD-10-CM | POA: Diagnosis not present

## 2018-11-21 ENCOUNTER — Ambulatory Visit: Payer: Medicare Other | Admitting: Psychology

## 2018-12-05 ENCOUNTER — Ambulatory Visit: Payer: Medicare Other | Admitting: Psychology

## 2018-12-11 DIAGNOSIS — M9904 Segmental and somatic dysfunction of sacral region: Secondary | ICD-10-CM | POA: Diagnosis not present

## 2018-12-11 DIAGNOSIS — M9903 Segmental and somatic dysfunction of lumbar region: Secondary | ICD-10-CM | POA: Diagnosis not present

## 2018-12-11 DIAGNOSIS — M9901 Segmental and somatic dysfunction of cervical region: Secondary | ICD-10-CM | POA: Diagnosis not present

## 2018-12-11 DIAGNOSIS — M5136 Other intervertebral disc degeneration, lumbar region: Secondary | ICD-10-CM | POA: Diagnosis not present

## 2018-12-19 ENCOUNTER — Ambulatory Visit: Payer: Medicare Other | Admitting: Psychology

## 2018-12-25 DIAGNOSIS — M9903 Segmental and somatic dysfunction of lumbar region: Secondary | ICD-10-CM | POA: Diagnosis not present

## 2018-12-25 DIAGNOSIS — M9904 Segmental and somatic dysfunction of sacral region: Secondary | ICD-10-CM | POA: Diagnosis not present

## 2018-12-25 DIAGNOSIS — M9901 Segmental and somatic dysfunction of cervical region: Secondary | ICD-10-CM | POA: Diagnosis not present

## 2018-12-25 DIAGNOSIS — M5136 Other intervertebral disc degeneration, lumbar region: Secondary | ICD-10-CM | POA: Diagnosis not present

## 2018-12-26 ENCOUNTER — Other Ambulatory Visit: Payer: Self-pay

## 2018-12-26 ENCOUNTER — Ambulatory Visit
Admission: RE | Admit: 2018-12-26 | Discharge: 2018-12-26 | Disposition: A | Discharge status: Home / Self Care | Payer: Medicare Other | Source: Ambulatory Visit | Attending: Family Medicine | Admitting: Family Medicine

## 2018-12-26 DIAGNOSIS — Z1231 Encounter for screening mammogram for malignant neoplasm of breast: Secondary | ICD-10-CM

## 2018-12-26 DIAGNOSIS — E2839 Other primary ovarian failure: Secondary | ICD-10-CM

## 2018-12-26 DIAGNOSIS — Z780 Asymptomatic menopausal state: Secondary | ICD-10-CM | POA: Diagnosis not present

## 2018-12-26 DIAGNOSIS — M85832 Other specified disorders of bone density and structure, left forearm: Secondary | ICD-10-CM | POA: Diagnosis not present

## 2019-01-02 ENCOUNTER — Ambulatory Visit (INDEPENDENT_AMBULATORY_CARE_PROVIDER_SITE_OTHER): Payer: Medicare Other | Admitting: Cardiovascular Disease

## 2019-01-02 ENCOUNTER — Other Ambulatory Visit: Payer: Self-pay

## 2019-01-02 ENCOUNTER — Encounter: Payer: Self-pay | Admitting: Cardiovascular Disease

## 2019-01-02 DIAGNOSIS — R0789 Other chest pain: Secondary | ICD-10-CM

## 2019-01-02 DIAGNOSIS — E782 Mixed hyperlipidemia: Secondary | ICD-10-CM

## 2019-01-02 NOTE — Assessment & Plan Note (Signed)
History of atypical chest pain with a negative Myoview 02/21/2018.  In retrospect, he thought her chest pain was stress related.  She has had no recurrent symptoms

## 2019-01-02 NOTE — Patient Instructions (Signed)
Medication Instructions:  Your physician recommends that you continue on your current medications as directed. Please refer to the Current Medication list given to you today.  If you need a refill on your cardiac medications before your next appointment, please call your pharmacy.   Lab work: NONE If you have labs (blood work) drawn today and your tests are completely normal, you will receive your results only by: Marland Kitchen MyChart Message (if you have MyChart) OR . A paper copy in the mail If you have any lab test that is abnormal or we need to change your treatment, we will call you to review the results.  Testing/Procedures: NONE  Follow-Up: At [facility], you and your health needs are our priority.  As part of our continuing mission to provide you with exceptional heart care, we have created designated Provider Care Teams.  These Care Teams include your primary Cardiologist (physician) and Advanced Practice Providers (APPs -  Physician Assistants and Nurse Practitioners) who all work together to provide you with the care you need, when you need it. . You will need a follow up appointment in 12 months WITH DR. Gwenlyn Found.  Please call our office 2 months in advance to schedule this appointment.    Any Other Special Instructions Will Be Listed Below (If Applicable). REFERRAL TO DR. HILTY IN THE LIPID DISORDERS CLINIC

## 2019-01-02 NOTE — Progress Notes (Signed)
01/02/2019 Lydia Osborne   [birth year]  [id number]  Primary Physician Lydia Amel, MD Primary Cardiologist: Lydia Harp MD Lydia Osborne, [place]  HPI:  Lydia Osborne is a 68 y.o.  moderately overweight married Caucasian female mother of 2, grandmother of 2 grandchildren referred by Lydia Osborne in the emergency room for cardiovascular evaluation because of chest pain.  She is retired from working in a group home.  I last saw her in the office 02/14/2018. Her risk factors include untreated hyperlipidemia attempting to improve with dietary modification and family history with father who had a myocardial infarction in his 91s.  She is never had a heart attack or stroke.  There is no history of GERD.  She had chest pain the last several months occurring several times a month lasting hours at a time.  Typically epigastric rating to her back without associated symptoms.  Is not brought on by exertion, activity or food.  She was seen in the emergency room on 01/14/2018 with the symptoms ruled out for myocardial infarction.  She had a Myoview stress test performed 02/21/2018 which was nonischemic.  Since I saw her in the office a year ago she is remained stable over denying symptoms of chest pain or shortness of breath.   Current Meds  Medication Sig  . levothyroxine (SYNTHROID, LEVOTHROID) 125 MCG tablet Take 125 mcg by mouth daily.  Marland Kitchen LORazepam (ATIVAN) 0.5 MG tablet Take 0.5 mg by mouth daily as needed for anxiety.     Allergies  Allergen Reactions  . Sulfa Antibiotics     Extended exposure causes itchiness    Social History   Socioeconomic History  . Marital status: Married    Spouse name: Not on file  . Number of children: Not on file  . Years of education: Not on file  . Highest education level: Not on file  Occupational History  . Not on file  Social Needs  . Financial resource strain: Not on file  . Food insecurity    Worry: Not on file    Inability: Not  on file  . Transportation needs    Medical: Not on file    Non-medical: Not on file  Tobacco Use  . Smoking status: Never Smoker  . Smokeless tobacco: Never Used  Substance and Sexual Activity  . Alcohol use: Not on file  . Drug use: Not on file  . Sexual activity: Not on file  Lifestyle  . Physical activity    Days per week: Not on file    Minutes per session: Not on file  . Stress: Not on file  Relationships  . Social Herbalist on phone: Not on file    Gets together: Not on file    Attends religious service: Not on file    Active member of club or organization: Not on file    Attends meetings of clubs or organizations: Not on file    Relationship status: Not on file  . Intimate partner violence    Fear of current or ex partner: Not on file    Emotionally abused: Not on file    Physically abused: Not on file    Forced sexual activity: Not on file  Other Topics Concern  . Not on file  Social History Narrative  . Not on file     Review of Systems: General: negative for chills, fever, night sweats or weight changes.  Cardiovascular: negative for chest pain, dyspnea  on exertion, edema, orthopnea, palpitations, paroxysmal nocturnal dyspnea or shortness of breath Dermatological: negative for rash Respiratory: negative for cough or wheezing Urologic: negative for hematuria Abdominal: negative for nausea, vomiting, diarrhea, bright red blood per rectum, melena, or hematemesis Neurologic: negative for visual changes, syncope, or dizziness All other systems reviewed and are otherwise negative except as noted above.    Blood pressure 118/78, pulse 77, temperature (!) 96.6 F (35.9 C), height 5\' 7"  (1.702 m), weight 216 lb 3.2 oz (98.1 kg), SpO2 96 %.  General appearance: alert and no distress Neck: no adenopathy, no carotid bruit, no JVD, supple, symmetrical, trachea midline and thyroid not enlarged, symmetric, no tenderness/mass/nodules Lungs: clear to  auscultation bilaterally Heart: regular rate and rhythm, S1, S2 normal, no murmur, click, rub or gallop Extremities: extremities normal, atraumatic, no cyanosis or edema Pulses: 2+ and symmetric Skin: Skin color, texture, turgor normal. No rashes or lesions Neurologic: Alert and oriented X 3, normal strength and tone. Normal symmetric reflexes. Normal coordination and gait  EKG sinus rhythm at 70 without ST or T wave changes.  Personally reviewed this EKG.  ASSESSMENT AND PLAN:   Atypical chest pain History of atypical chest pain with a negative Myoview 02/21/2018.  In retrospect, he thought her chest pain was stress related.  She has had no recurrent symptoms  Hyperlipidemia History of hyperlipidemia intolerant to statin therapy with lipid profile performed 10/18/2018 revealing total cholesterol 251, LDL of 158 and HDL 55.  I am going to refer her to Dr. Rennis GoldenHilty in our lipid clinic for further evaluation and treatment options.      Runell GessJonathan J. Regena Delucchi MD FACP,FACC,FAHA, [facility] 01/02/2019 9:01 AM

## 2019-01-02 NOTE — Assessment & Plan Note (Signed)
History of hyperlipidemia intolerant to statin therapy with lipid profile performed 10/18/2018 revealing total cholesterol 251, LDL of 158 and HDL 55.  I am going to refer her to Dr. Debara Pickett in our lipid clinic for further evaluation and treatment options.

## 2019-01-09 DIAGNOSIS — M9903 Segmental and somatic dysfunction of lumbar region: Secondary | ICD-10-CM | POA: Diagnosis not present

## 2019-01-09 DIAGNOSIS — M9904 Segmental and somatic dysfunction of sacral region: Secondary | ICD-10-CM | POA: Diagnosis not present

## 2019-01-09 DIAGNOSIS — M5136 Other intervertebral disc degeneration, lumbar region: Secondary | ICD-10-CM | POA: Diagnosis not present

## 2019-01-09 DIAGNOSIS — M9901 Segmental and somatic dysfunction of cervical region: Secondary | ICD-10-CM | POA: Diagnosis not present

## 2019-01-22 DIAGNOSIS — M9903 Segmental and somatic dysfunction of lumbar region: Secondary | ICD-10-CM | POA: Diagnosis not present

## 2019-01-22 DIAGNOSIS — M9901 Segmental and somatic dysfunction of cervical region: Secondary | ICD-10-CM | POA: Diagnosis not present

## 2019-01-22 DIAGNOSIS — M5136 Other intervertebral disc degeneration, lumbar region: Secondary | ICD-10-CM | POA: Diagnosis not present

## 2019-01-22 DIAGNOSIS — M9904 Segmental and somatic dysfunction of sacral region: Secondary | ICD-10-CM | POA: Diagnosis not present

## 2019-01-26 DIAGNOSIS — M5136 Other intervertebral disc degeneration, lumbar region: Secondary | ICD-10-CM | POA: Diagnosis not present

## 2019-01-26 DIAGNOSIS — M9903 Segmental and somatic dysfunction of lumbar region: Secondary | ICD-10-CM | POA: Diagnosis not present

## 2019-01-26 DIAGNOSIS — M9904 Segmental and somatic dysfunction of sacral region: Secondary | ICD-10-CM | POA: Diagnosis not present

## 2019-01-26 DIAGNOSIS — M9901 Segmental and somatic dysfunction of cervical region: Secondary | ICD-10-CM | POA: Diagnosis not present

## 2019-01-29 DIAGNOSIS — M9904 Segmental and somatic dysfunction of sacral region: Secondary | ICD-10-CM | POA: Diagnosis not present

## 2019-01-29 DIAGNOSIS — M5136 Other intervertebral disc degeneration, lumbar region: Secondary | ICD-10-CM | POA: Diagnosis not present

## 2019-01-29 DIAGNOSIS — M9903 Segmental and somatic dysfunction of lumbar region: Secondary | ICD-10-CM | POA: Diagnosis not present

## 2019-01-29 DIAGNOSIS — M9901 Segmental and somatic dysfunction of cervical region: Secondary | ICD-10-CM | POA: Diagnosis not present

## 2019-01-31 DIAGNOSIS — M5136 Other intervertebral disc degeneration, lumbar region: Secondary | ICD-10-CM | POA: Diagnosis not present

## 2019-01-31 DIAGNOSIS — M9901 Segmental and somatic dysfunction of cervical region: Secondary | ICD-10-CM | POA: Diagnosis not present

## 2019-01-31 DIAGNOSIS — M9904 Segmental and somatic dysfunction of sacral region: Secondary | ICD-10-CM | POA: Diagnosis not present

## 2019-01-31 DIAGNOSIS — M9903 Segmental and somatic dysfunction of lumbar region: Secondary | ICD-10-CM | POA: Diagnosis not present

## 2019-02-02 DIAGNOSIS — M9903 Segmental and somatic dysfunction of lumbar region: Secondary | ICD-10-CM | POA: Diagnosis not present

## 2019-02-02 DIAGNOSIS — M9901 Segmental and somatic dysfunction of cervical region: Secondary | ICD-10-CM | POA: Diagnosis not present

## 2019-02-02 DIAGNOSIS — M9904 Segmental and somatic dysfunction of sacral region: Secondary | ICD-10-CM | POA: Diagnosis not present

## 2019-02-02 DIAGNOSIS — M5136 Other intervertebral disc degeneration, lumbar region: Secondary | ICD-10-CM | POA: Diagnosis not present

## 2019-02-06 DIAGNOSIS — M9903 Segmental and somatic dysfunction of lumbar region: Secondary | ICD-10-CM | POA: Diagnosis not present

## 2019-02-06 DIAGNOSIS — M9904 Segmental and somatic dysfunction of sacral region: Secondary | ICD-10-CM | POA: Diagnosis not present

## 2019-02-06 DIAGNOSIS — M5136 Other intervertebral disc degeneration, lumbar region: Secondary | ICD-10-CM | POA: Diagnosis not present

## 2019-02-06 DIAGNOSIS — M9901 Segmental and somatic dysfunction of cervical region: Secondary | ICD-10-CM | POA: Diagnosis not present

## 2019-02-13 DIAGNOSIS — M9904 Segmental and somatic dysfunction of sacral region: Secondary | ICD-10-CM | POA: Diagnosis not present

## 2019-02-13 DIAGNOSIS — M9901 Segmental and somatic dysfunction of cervical region: Secondary | ICD-10-CM | POA: Diagnosis not present

## 2019-02-13 DIAGNOSIS — M5136 Other intervertebral disc degeneration, lumbar region: Secondary | ICD-10-CM | POA: Diagnosis not present

## 2019-02-13 DIAGNOSIS — M9903 Segmental and somatic dysfunction of lumbar region: Secondary | ICD-10-CM | POA: Diagnosis not present

## 2019-02-26 DIAGNOSIS — M5136 Other intervertebral disc degeneration, lumbar region: Secondary | ICD-10-CM | POA: Diagnosis not present

## 2019-02-26 DIAGNOSIS — M9901 Segmental and somatic dysfunction of cervical region: Secondary | ICD-10-CM | POA: Diagnosis not present

## 2019-02-26 DIAGNOSIS — M9904 Segmental and somatic dysfunction of sacral region: Secondary | ICD-10-CM | POA: Diagnosis not present

## 2019-02-26 DIAGNOSIS — M9903 Segmental and somatic dysfunction of lumbar region: Secondary | ICD-10-CM | POA: Diagnosis not present

## 2019-02-27 ENCOUNTER — Ambulatory Visit: Payer: Medicare Other | Admitting: Internal Medicine

## 2019-02-27 ENCOUNTER — Other Ambulatory Visit: Payer: Self-pay

## 2019-02-27 ENCOUNTER — Encounter: Payer: Self-pay | Admitting: Internal Medicine

## 2019-02-27 VITALS — BP 110/70 | HR 76 | Temp 96.7°F | Ht 67.0 in | Wt 218.0 lb

## 2019-02-27 DIAGNOSIS — Z8249 Family history of ischemic heart disease and other diseases of the circulatory system: Secondary | ICD-10-CM | POA: Diagnosis not present

## 2019-02-27 DIAGNOSIS — E782 Mixed hyperlipidemia: Secondary | ICD-10-CM | POA: Diagnosis not present

## 2019-02-27 NOTE — Patient Instructions (Addendum)
Medication Instructions:  Your physician recommends that you continue on your current medications as directed. Please refer to the Current Medication list given to you today.  If you need a refill on your cardiac medications before your next appointment, please call your pharmacy.   Lab work: FASTING lab work to check cholesterol prior to next appointment If you have labs (blood work) drawn today and your tests are completely normal, you will receive your results only by: Marland Kitchen MyChart Message (if you have MyChart) OR . A paper copy in the mail If you have any lab test that is abnormal or we need to change your treatment, we will call you to review the results.  Testing/Procedures: Dr. Rennis Golden has ordered a CT coronary calcium score. This test is done at [address]. This is $150 out of pocket.   Coronary CalciumScan A coronary calcium scan is an imaging test used to look for deposits of calcium and other fatty materials (plaques) in the inner lining of the blood vessels of the heart (coronary arteries). These deposits of calcium and plaques can partly clog and narrow the coronary arteries without producing any symptoms or warning signs. This puts a person at risk for a heart attack. This test can detect these deposits before symptoms develop. Tell a health care provider about:  Any allergies you have.  All medicines you are taking, including vitamins, herbs, eye drops, creams, and over-the-counter medicines.  Any problems you or family members have had with anesthetic medicines.  Any blood disorders you have.  Any surgeries you have had.  Any medical conditions you have.  Whether you are pregnant or may be pregnant. What are the risks? Generally, this is a safe procedure. However, problems may occur, including:  Harm to a pregnant woman and her unborn baby. This test involves the use of radiation. Radiation exposure can be dangerous to a pregnant woman and her  unborn baby. If you are pregnant, you generally should not have this procedure done.  Slight increase in the risk of cancer. This is because of the radiation involved in the test. What happens before the procedure? No preparation is needed for this procedure. What happens during the procedure?  You will undress and remove any jewelry around your neck or chest.  You will put on a hospital gown.  Sticky electrodes will be placed on your chest. The electrodes will be connected to an electrocardiogram (ECG) machine to record a tracing of the electrical activity of your heart.  A CT scanner will take pictures of your heart. During this time, you will be asked to lie still and hold your breath for 2-3 seconds while a picture of your heart is being taken. The procedure may vary among health care providers and hospitals. What happens after the procedure?  You can get dressed.  You can return to your normal activities.  It is up to you to get the results of your test. Ask your health care provider, or the department that is doing the test, when your results will be ready. Summary  A coronary calcium scan is an imaging test used to look for deposits of calcium and other fatty materials (plaques) in the inner lining of the blood vessels of the heart (coronary arteries).  Generally, this is a safe procedure. Tell your health care provider if you are pregnant or may be pregnant.  No preparation is needed for this procedure.  A CT scanner will take pictures of your  heart.  You can return to your normal activities after the scan is done. This information is not intended to replace advice given to you by your health care provider. Make sure you discuss any questions you have with your health care provider. Document Released: 11/13/2007 Document Revised: 04/05/2016 Document Reviewed: 04/05/2016 Elsevier Interactive Patient Education  2017 Elsevier Inc.    Follow-Up: Dr. Debara Pickett recommends that  you schedule a follow up visit with him the in the [facility] in 3 months - virtual visit. Please have fasting blood work about 1 week prior to this visit and he will review the blood work results with you at your appointment.

## 2019-02-27 NOTE — Progress Notes (Signed)
LIPID CLINIC CONSULT NOTE  Chief Complaint:  Manage dyslipidemia  Primary Care Physician: Lydia Osborne, Lydia Osborne  Primary Cardiologist:  No primary care provider on file.  HPI:  Lydia Osborne is a 68 y.o. female who is being seen today for the evaluation of lipidemia at the request of Lydia Osborne, Lydia Osborne.  This is a pleasant 68 year old female with a history of Graves' disease status post surgical thyroidectomy.  She is now dependent on thyroid medication.  She also has a history of dyslipidemia and statin intolerance.  Previously she had been on atorvastatin only, however that medication caused her to be "out of her mind".  She has been hesitant to take additional medications.  Most recently she had a lipid profile performed at her primary care provider's office showed "total cholesterol 251, HDL 55, LDL 158 and triglycerides 192.  There is family history of premature coronary disease in her father who had MI at age 53.  She reports generally healthy diet however is overweight.  She tries to walk and exercise however more sporadically.  She denies any other risk factors such as hypertension, diabetes, tobacco use or other factors.  PMHx:  Past Medical History:  Diagnosis Date  . Graves' disease in remission   . Hyperlipidemia     Past Surgical History:  Procedure Laterality Date  . BREAST BIOPSY Right   . BREAST EXCISIONAL BIOPSY Left    x2    FAMHx:  Family History  Problem Relation Age of Onset  . Breast cancer Mother   . Thyroid disease Mother   . Heart disease Father   . Throat cancer Father     SOCHx:   reports that she has never smoked. She has never used smokeless tobacco. She reports current alcohol use of about 1.0 standard drinks of alcohol per week. She reports that she does not use drugs.  ALLERGIES:  Allergies  Allergen Reactions  . Sulfa Antibiotics     Extended exposure causes itchiness    ROS: Pertinent items noted in HPI and remainder of  comprehensive ROS otherwise negative.  HOME MEDS: Current Outpatient Medications on File Prior to Visit  Medication Sig Dispense Refill  . levothyroxine (SYNTHROID, LEVOTHROID) 125 MCG tablet Take 125 mcg by mouth daily.  3  . LORazepam (ATIVAN) 0.5 MG tablet Take 0.5 mg by mouth daily as needed for anxiety.  0   No current facility-administered medications on file prior to visit.     LABS/IMAGING: No results found for this or any previous visit (from the past 48 hour(s)). No results found.  LIPID PANEL: No results found for: CHOL, TRIG, HDL, CHOLHDL, VLDL, LDLCALC, LDLDIRECT  WEIGHTS: Wt Readings from Last 3 Encounters:  02/27/19 218 lb (98.9 kg)  01/02/19 216 lb 3.2 oz (98.1 kg)  02/21/18 202 lb (91.6 kg)    VITALS: BP 110/70 (BP Location: Left Arm, Patient Position: Sitting, Cuff Size: Normal)   Pulse 76   Temp (!) 96.7 F (35.9 C)   Ht 5\' 7"  (1.702 m)   Wt 218 lb (98.9 kg)   BMI 34.14 kg/m   EXAM: General appearance: alert and no distress Neck: no carotid bruit, no JVD and thyroid not enlarged, symmetric, no tenderness/mass/nodules Lungs: clear to auscultation bilaterally Heart: regular rate and rhythm, S1, S2 normal, no murmur, click, rub or gallop Abdomen: soft, non-tender; bowel sounds normal; no masses,  no organomegaly Extremities: extremities normal, atraumatic, no cyanosis or edema Pulses: 2+ and symmetric Skin: Skin color,  texture, turgor normal. No rashes or lesions Neurologic: Grossly normal Psych: Pleasant  EKG: Deferred  ASSESSMENT: 1. Mixed dyslipidemia 2. Intolerant to atorvastatin-(cognitive dysfunction)  PLAN: 1.   Lydia Osborne has a mixed dyslipidemia with family history of early onset heart disease.  She has been intolerant to atorvastatin causing some cognitive dysfunction and is hesitant to try another statin.  After much discussion she understands that this may not necessarily happen with additional medication.  We then discussed the  possibility of coronary artery calcium scoring which I think would be helpful to further re-stratify her given her intermediate risk based on age and early onset heart disease in her father.  If she was found to have a high coronary calcium score, would target LDL less than 70 and recommend more aggressive therapy, alternatively if her calcium score is 0 or low, will focus mostly on dietary interventions and perhaps low-dose therapies.  We could also consider alternative such as ezetimibe or Nexletol.  Most likely however I would consider low-dose rosuvastatin.  We will contact her with the results of her calcium score and plan follow-up in 3 months or sooner as necessary.  Thanks again for the kind referral.  Lydia Nose, Lydia Osborne, Lydia [facility]  [place]  [facility]  Medical Director of the Advanced Lipid Disorders &  Cardiovascular Risk Reduction Clinic Diplomate of the American Board of Clinical Lipidology Attending Cardiologist  Direct Dial: [phone]  Fax: [phone]  Website:  [name] Nicely Milik Gilreath 02/27/2019, 8:50 AM

## 2019-03-12 DIAGNOSIS — M9901 Segmental and somatic dysfunction of cervical region: Secondary | ICD-10-CM | POA: Diagnosis not present

## 2019-03-12 DIAGNOSIS — M9904 Segmental and somatic dysfunction of sacral region: Secondary | ICD-10-CM | POA: Diagnosis not present

## 2019-03-12 DIAGNOSIS — M5136 Other intervertebral disc degeneration, lumbar region: Secondary | ICD-10-CM | POA: Diagnosis not present

## 2019-03-12 DIAGNOSIS — M9903 Segmental and somatic dysfunction of lumbar region: Secondary | ICD-10-CM | POA: Diagnosis not present

## 2019-03-13 ENCOUNTER — Ambulatory Visit (INDEPENDENT_AMBULATORY_CARE_PROVIDER_SITE_OTHER)
Admission: RE | Admit: 2019-03-13 | Discharge: 2019-03-13 | Disposition: A | Discharge status: Home / Self Care | Payer: Self-pay | Source: Ambulatory Visit | Attending: Internal Medicine | Admitting: Internal Medicine

## 2019-03-13 ENCOUNTER — Other Ambulatory Visit: Payer: Self-pay

## 2019-03-13 DIAGNOSIS — Z8249 Family history of ischemic heart disease and other diseases of the circulatory system: Secondary | ICD-10-CM

## 2019-03-13 DIAGNOSIS — E782 Mixed hyperlipidemia: Secondary | ICD-10-CM

## 2019-03-14 ENCOUNTER — Telehealth: Payer: Self-pay | Admitting: Internal Medicine

## 2019-03-14 MED ORDER — ROSUVASTATIN CALCIUM 5 MG PO TABS: 5.0000 mg | ORAL_TABLET | Freq: Every day | ORAL | 3 refills | Status: DC

## 2019-03-14 NOTE — Telephone Encounter (Signed)
Patient called with CT calcium score results and agreed w/MD recommendation to start crestor 5mg  QD and repeat lipids in 3 months (before next visit - to be scheduled). Rx(s) sent to pharmacy electronically.

## 2019-03-14 NOTE — Telephone Encounter (Addendum)
Message from Pixie Casino, MD sent at 03/14/2019  9:31 AM EDT ----- Recommend we try Crestor 5 mg daily - repeat lipid in 3 months. Target LDL <100.  Dr Lemmie Evens  Notes recorded by Pixie Casino, MD on 03/13/2019 at 9:06 AM EDT  Low risk calcium score of 37, predominantly in the LAD.   Dr Lemmie Evens

## 2019-04-02 DIAGNOSIS — M5136 Other intervertebral disc degeneration, lumbar region: Secondary | ICD-10-CM | POA: Diagnosis not present

## 2019-04-02 DIAGNOSIS — M9901 Segmental and somatic dysfunction of cervical region: Secondary | ICD-10-CM | POA: Diagnosis not present

## 2019-04-02 DIAGNOSIS — M9904 Segmental and somatic dysfunction of sacral region: Secondary | ICD-10-CM | POA: Diagnosis not present

## 2019-04-02 DIAGNOSIS — M9903 Segmental and somatic dysfunction of lumbar region: Secondary | ICD-10-CM | POA: Diagnosis not present

## 2019-04-16 ENCOUNTER — Other Ambulatory Visit: Payer: Self-pay | Admitting: Family Medicine

## 2019-04-16 DIAGNOSIS — R911 Solitary pulmonary nodule: Secondary | ICD-10-CM

## 2019-04-17 DIAGNOSIS — M5136 Other intervertebral disc degeneration, lumbar region: Secondary | ICD-10-CM | POA: Diagnosis not present

## 2019-04-17 DIAGNOSIS — M9901 Segmental and somatic dysfunction of cervical region: Secondary | ICD-10-CM | POA: Diagnosis not present

## 2019-04-17 DIAGNOSIS — M9903 Segmental and somatic dysfunction of lumbar region: Secondary | ICD-10-CM | POA: Diagnosis not present

## 2019-04-17 DIAGNOSIS — M9904 Segmental and somatic dysfunction of sacral region: Secondary | ICD-10-CM | POA: Diagnosis not present

## 2019-04-25 ENCOUNTER — Ambulatory Visit
Admission: RE | Admit: 2019-04-25 | Discharge: 2019-04-25 | Disposition: A | Discharge status: Home / Self Care | Payer: Medicare Other | Source: Ambulatory Visit | Attending: Family Medicine | Admitting: Family Medicine

## 2019-04-25 DIAGNOSIS — R911 Solitary pulmonary nodule: Secondary | ICD-10-CM

## 2019-04-25 DIAGNOSIS — R918 Other nonspecific abnormal finding of lung field: Secondary | ICD-10-CM | POA: Diagnosis not present

## 2019-05-01 DIAGNOSIS — M5136 Other intervertebral disc degeneration, lumbar region: Secondary | ICD-10-CM | POA: Diagnosis not present

## 2019-05-01 DIAGNOSIS — M9904 Segmental and somatic dysfunction of sacral region: Secondary | ICD-10-CM | POA: Diagnosis not present

## 2019-05-01 DIAGNOSIS — M9901 Segmental and somatic dysfunction of cervical region: Secondary | ICD-10-CM | POA: Diagnosis not present

## 2019-05-01 DIAGNOSIS — M9903 Segmental and somatic dysfunction of lumbar region: Secondary | ICD-10-CM | POA: Diagnosis not present

## 2019-05-14 DIAGNOSIS — Z0120 Encounter for dental examination and cleaning without abnormal findings: Secondary | ICD-10-CM | POA: Diagnosis not present

## 2019-05-17 DIAGNOSIS — E782 Mixed hyperlipidemia: Secondary | ICD-10-CM | POA: Diagnosis not present

## 2019-05-17 LAB — LIPID PANEL
Chol/HDL Ratio: 2.5 ratio (ref 0.0–4.4)
Cholesterol, Total: 168 mg/dL (ref 100–199)
HDL: 68 mg/dL (ref >39)
LDL Chol Calc (NIH): 78 mg/dL (ref 0–99)
Triglycerides: 128 mg/dL (ref 0–149)
VLDL Cholesterol Cal: 22 mg/dL (ref 5–40)

## 2019-05-28 ENCOUNTER — Encounter: Payer: Self-pay | Admitting: Internal Medicine

## 2019-05-28 ENCOUNTER — Telehealth (INDEPENDENT_AMBULATORY_CARE_PROVIDER_SITE_OTHER): Payer: Medicare Other | Admitting: Internal Medicine

## 2019-05-28 VITALS — Ht 67.0 in | Wt 213.0 lb

## 2019-05-28 DIAGNOSIS — R0789 Other chest pain: Secondary | ICD-10-CM

## 2019-05-28 DIAGNOSIS — R931 Abnormal findings on diagnostic imaging of heart and coronary circulation: Secondary | ICD-10-CM

## 2019-05-28 DIAGNOSIS — E782 Mixed hyperlipidemia: Secondary | ICD-10-CM

## 2019-05-28 NOTE — Progress Notes (Signed)
Virtual Visit via Telephone Note   This visit type was conducted due to national recommendations for restrictions regarding the COVID-19 Pandemic (e.g. social distancing) in an effort to limit this patient's exposure and mitigate transmission in our community.  Due to her co-morbid illnesses, this patient is at least at moderate risk for complications without adequate follow up.  This format is felt to be most appropriate for this patient at this time.  The patient did not have access to video technology/had technical difficulties with video requiring transitioning to audio format only (telephone).  All issues noted in this document were discussed and addressed.  No physical exam could be performed with this format.  Please refer to the patient's chart for her  consent to telehealth for [facility].   Evaluation Performed:  Telephone follow-up  Date:  05/28/2019   ID:  Lydia Osborne, DOB January 20, 1951, MRN [id number]  Patient Location:  [place] [address]  Provider location:   [address] [place], [place] [address]  PCP:  Lujean Amel, MD  Cardiologist:  No primary care provider on file. Electrophysiologist:  None   Chief Complaint:  No complaints  History of Present Illness:    Lydia Osborne is a 68 y.o. female who presents via audio/video conferencing for a telehealth visit today.  This is a pleasant 68 year old female with a history of Graves' disease status post surgical thyroidectomy.  She is now dependent on thyroid medication.  She also has a history of dyslipidemia and statin intolerance.  Previously she had been on atorvastatin only, however that medication caused her to be "out of her mind".  She has been hesitant to take additional medications.  Most recently she had a lipid profile performed at her primary care provider's office showed "total cholesterol 251, HDL 55, LDL 158 and triglycerides 192.  There is family history  of premature coronary disease in her father who had MI at age 59.  She reports generally healthy diet however is overweight.  She tries to walk and exercise however more sporadically.  She denies any other risk factors such as hypertension, diabetes, tobacco use or other factors.  05/28/2019  Lydia Osborne is seen today for follow-up.  She had coronary artery calcium scoring performed in October which showed an elevated score 18, this was only 38th percentile for age and sex matched control however abnormal.  Given her family history and risk factors, I recommended statin therapy with targeting LDL less than 100 or ideally if aggressive given her early onset heart disease in father, less than 26.  She was started on low-dose rosuvastatin 5 mg daily.  She is tolerating this well notes no cognitive defects.  This is also resulted in excellent lipid improvement.  Labs as of December showed total cholesterol 168, triglycerides 128, HDL 68 and LDL of 78.  This is near target of 70 and represents a nice reduction in cholesterol.  The patient does not have symptoms concerning for COVID-19 infection (fever, chills, cough, or new SHORTNESS OF BREATH).    Prior CV studies:   The following studies were reviewed today:  Reviewed  PMHx:  Past Medical History:  Diagnosis Date  . Graves' disease in remission   . Hyperlipidemia     Past Surgical History:  Procedure Laterality Date  . BREAST BIOPSY Right   . BREAST EXCISIONAL BIOPSY Left    x2    FAMHx:  Family History  Problem Relation Age of Onset  .  Breast cancer Mother   . Thyroid disease Mother   . Heart disease Father   . Throat cancer Father     SOCHx:   reports that she has never smoked. She has never used smokeless tobacco. She reports current alcohol use of about 1.0 standard drinks of alcohol per week. She reports that she does not use drugs.  ALLERGIES:  Allergies  Allergen Reactions  . Sulfa Antibiotics     Extended exposure  causes itchiness    MEDS:  Current Meds  Medication Sig  . levothyroxine (SYNTHROID, LEVOTHROID) 125 MCG tablet Take 125 mcg by mouth daily.  Marland Kitchen LORazepam (ATIVAN) 0.5 MG tablet Take 0.5 mg by mouth daily as needed for anxiety.  . rosuvastatin (CRESTOR) 5 MG tablet Take 1 tablet (5 mg total) by mouth daily.     ROS: Pertinent items noted in HPI and remainder of comprehensive ROS otherwise negative.  Labs/Other Tests and Data Reviewed:    Recent Labs: No results found for requested labs within last 8760 hours.   Recent Lipid Panel Lab Results  Component Value Date/Time   CHOL 168 05/17/2019 08:06 AM   TRIG 128 05/17/2019 08:06 AM   HDL 68 05/17/2019 08:06 AM   CHOLHDL 2.5 05/17/2019 08:06 AM   LDLCALC 78 05/17/2019 08:06 AM    Wt Readings from Last 3 Encounters:  05/28/19 213 lb (96.6 kg)  02/27/19 218 lb (98.9 kg)  01/02/19 216 lb 3.2 oz (98.1 kg)     Exam:    Vital Signs:  Ht 5\' 7"  (1.702 m)   Wt 213 lb (96.6 kg)   BMI 33.36 kg/m    Exam not performed due to telephone visit  ASSESSMENT & PLAN:    1. Mixed dyslipidemia 2. Cognitive dysfunction with atorvastatin 3. CAC score 37 4. Family history of premature coronary disease  Lydia Osborne has had marked improvement in her dyslipidemia due to dietary interventions and low-dose statin.  Her weight is down about 5 pounds since I last saw her.  Cholesterol now is near goal of 70 LDL.  She had a low calcium score which is reassuring but abnormal and clearly necessitates statin therapy.  I would recommend continuing this indefinitely.  Follow-up with Dr. and me PRN.  COVID-19 Education: The signs and symptoms of COVID-19 were discussed with the patient and how to seek care for testing (follow up with PCP or arrange E-visit).  The importance of social distancing was discussed today.  Patient Risk:   After full review of this patients clinical status, I feel that they are at least moderate risk at this  time.  Time:   Today, I have spent 15 minutes with the patient with telehealth technology discussing dyslipidemia, cognitive function, coronary calcium score.     Medication Adjustments/Labs and Tests Ordered: Current medicines are reviewed at length with the patient today.  Concerns regarding medicines are outlined above.   Tests Ordered: No orders of the defined types were placed in this encounter.   Medication Changes: No orders of the defined types were placed in this encounter.   Disposition:  prn  Allyson Sabal, MD, Chrystie Nose  [place]  [facility]  Medical Director of the Advanced Lipid Disorders &  Cardiovascular Risk Reduction Clinic Diplomate of the American Board of Clinical Lipidology Attending Cardiologist  Direct Dial: [phone]  Fax: [phone]  Website:  [email or web address]  [phone], MD  05/28/2019 8:09 AM

## 2019-05-28 NOTE — Patient Instructions (Signed)
Medication Instructions:  NO CHANGES *If you need a refill on your cardiac medications before your next appointment, please call your pharmacy*  Lab Work: NONE NEEDED If you have labs (blood work) drawn today and your tests are completely normal, you will receive your results only by: Marland Kitchen MyChart Message (if you have MyChart) OR . A paper copy in the mail If you have any lab test that is abnormal or we need to change your treatment, we will call you to review the results.  Testing/Procedures: NONE NEEDED  Follow-Up: At [facility], you and your health needs are our priority.  As part of our continuing mission to provide you with exceptional heart care, we have created designated Provider Care Teams.  These Care Teams include your primary Cardiologist (physician) and Advanced Practice Providers (APPs -  Physician Assistants and Nurse Practitioners) who all work together to provide you with the care you need, when you need it.  Your next appointment:   Follow up as needed with Dr. Debara Pickett Follow up in August with Dr. Gwenlyn Found

## 2019-07-13 DIAGNOSIS — M9903 Segmental and somatic dysfunction of lumbar region: Secondary | ICD-10-CM | POA: Diagnosis not present

## 2019-07-13 DIAGNOSIS — M9901 Segmental and somatic dysfunction of cervical region: Secondary | ICD-10-CM | POA: Diagnosis not present

## 2019-07-13 DIAGNOSIS — M9904 Segmental and somatic dysfunction of sacral region: Secondary | ICD-10-CM | POA: Diagnosis not present

## 2019-07-13 DIAGNOSIS — M5136 Other intervertebral disc degeneration, lumbar region: Secondary | ICD-10-CM | POA: Diagnosis not present

## 2019-07-27 DIAGNOSIS — M9904 Segmental and somatic dysfunction of sacral region: Secondary | ICD-10-CM | POA: Diagnosis not present

## 2019-07-27 DIAGNOSIS — M5136 Other intervertebral disc degeneration, lumbar region: Secondary | ICD-10-CM | POA: Diagnosis not present

## 2019-07-27 DIAGNOSIS — M9901 Segmental and somatic dysfunction of cervical region: Secondary | ICD-10-CM | POA: Diagnosis not present

## 2019-07-27 DIAGNOSIS — M9903 Segmental and somatic dysfunction of lumbar region: Secondary | ICD-10-CM | POA: Diagnosis not present

## 2019-08-10 DIAGNOSIS — K625 Hemorrhage of anus and rectum: Secondary | ICD-10-CM | POA: Diagnosis not present

## 2019-08-10 DIAGNOSIS — M7542 Impingement syndrome of left shoulder: Secondary | ICD-10-CM | POA: Diagnosis not present

## 2019-08-10 DIAGNOSIS — Z1211 Encounter for screening for malignant neoplasm of colon: Secondary | ICD-10-CM | POA: Diagnosis not present

## 2019-08-13 DIAGNOSIS — M5136 Other intervertebral disc degeneration, lumbar region: Secondary | ICD-10-CM | POA: Diagnosis not present

## 2019-08-13 DIAGNOSIS — M9901 Segmental and somatic dysfunction of cervical region: Secondary | ICD-10-CM | POA: Diagnosis not present

## 2019-08-13 DIAGNOSIS — M9904 Segmental and somatic dysfunction of sacral region: Secondary | ICD-10-CM | POA: Diagnosis not present

## 2019-08-13 DIAGNOSIS — M9903 Segmental and somatic dysfunction of lumbar region: Secondary | ICD-10-CM | POA: Diagnosis not present

## 2019-08-16 ENCOUNTER — Ambulatory Visit: Payer: Medicare Other | Attending: Family Medicine

## 2019-08-16 ENCOUNTER — Other Ambulatory Visit: Payer: Self-pay

## 2019-08-16 DIAGNOSIS — M25512 Pain in left shoulder: Secondary | ICD-10-CM | POA: Insufficient documentation

## 2019-08-16 DIAGNOSIS — R293 Abnormal posture: Secondary | ICD-10-CM

## 2019-08-16 DIAGNOSIS — M25612 Stiffness of left shoulder, not elsewhere classified: Secondary | ICD-10-CM | POA: Diagnosis not present

## 2019-08-16 DIAGNOSIS — G8929 Other chronic pain: Secondary | ICD-10-CM | POA: Diagnosis not present

## 2019-08-16 NOTE — Therapy (Signed)
[facility] [address], [address] [place], [place], [address] Phone: [phone]   Fax:  [phone]  Physical Therapy Evaluation  Patient Details  Name: Lydia Osborne MRN: [id number] Date of Birth: 1950/06/13 Referring Provider (PT): Darrow Bussing, MD   Encounter Date: 08/16/2019  PT End of Session - 08/16/19 1009    Visit Number  1    Date for PT Re-Evaluation  10/11/19    Authorization Type  BCBS Medicare    PT Start Time  0935    PT Stop Time  1013    PT Time Calculation (min)  38 min       Past Medical History:  Diagnosis Date  . Graves' disease in remission   . Hyperlipidemia     Past Surgical History:  Procedure Laterality Date  . BREAST BIOPSY Right   . BREAST EXCISIONAL BIOPSY Left    x2    There were no vitals filed for this visit.   Subjective Assessment - 08/16/19 0938    Subjective  Pt is a Rt hand dominant female who presents to PT with complaints of Lt shoulder pain that is chronic and has worsened over the past couple of months.  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    Pertinent History  Bil hip and knees replaced.    Diagnostic tests  none    Patient Stated Goals  reduce shoulder pain    Currently in Pain?  Yes    Pain Score  0-No pain   5-6/10   Pain Location  Shoulder    Pain Orientation  Left    Pain Descriptors / Indicators  Sore    Pain Type  Chronic pain    Pain Onset  More than a month ago    Pain Frequency  Intermittent    Aggravating Factors   overhead use, lifting for work, sleep on the Lt side    Pain Relieving Factors  pulleys at home, tuning fork         [facility] PT Assessment - 08/16/19 0001      Assessment   Medical Diagnosis  impingement syndrome Lt shoulder    Referring Provider (PT)  Koirala, Dibas, MD    Onset Date/Surgical Date  06/18/19    Hand Dominance  Right      Precautions   Precautions  None      Restrictions   Weight  Bearing Restrictions  No      Balance Screen   Has the patient fallen in the past 6 months  No    Has the patient had a decrease in activity level because of a fear of falling?   No    Is the patient reluctant to leave their home because of a fear of falling?   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  Cervical A/ROM is limited by 25% into sidebending and rotation without pain    AROM Assessment Site  Shoulder    Right/Left Shoulder  Left    Left Shoulder Flexion  90 Degrees    Left Shoulder ABduction  65 Degrees    Left Shoulder Internal Rotation  --   to Lt buttock   Left Shoulder External Rotation  --   to c1     PROM   Overall PROM   Deficits    PROM Assessment Site  Shoulder    Right/Left Shoulder  Left    Left Shoulder Flexion  105 Degrees    Left Shoulder ABduction  80 Degrees    Left Shoulder Internal Rotation  45 Degrees    Left Shoulder External Rotation  14 Degrees      Strength   Overall Strength  Deficits    Overall Strength Comments  Rt shoulder     Strength Assessment Site  Shoulder    Right/Left Shoulder  Left    Left Shoulder Flexion  4+/5    Left Shoulder ABduction  3+/5    Left Shoulder Internal Rotation  4/5     Left Shoulder External Rotation  4-/5      Palpation   Palpation comment  global palpable tenderness over Lt glenohumeral joint. Reduced joint mobility with pain in all directions                Objective measurements completed on examination: See above findings.              PT Education - 08/16/19 1008    Education Details  Access Code: [facility]    Person(s) Educated  Patient    Methods  Explanation;Demonstration;Handout    Comprehension  Verbalized understanding;Returned demonstration       PT Short Term Goals - 08/16/19 0943      PT SHORT TERM GOAL #1   Title  be indepenent in initial HEP    Time  4    Period  Weeks    Status  New    Target Date  09/13/19      PT SHORT TERM GOAL #2   Title  demonstrate Lt shoulder A/ROM flexion to > or = to 110 degrees to improve reaching overhead    Time  4    Period  Weeks    Status  New    Target Date  09/13/19      PT SHORT TERM GOAL #3   Title  demonstrate Lt shoulder A/ROM IR to L3 to improve ease with dressing    Time  4    Period  Weeks    Status  New    Target Date  09/13/19      PT SHORT TERM GOAL #4   Title  report a 30% reduction in Lt shoulder pain with use with ADLs    Time  4    Period  Weeks    Status  New    Target Date  09/13/19        PT Long Term Goals - 08/16/19 1017      PT LONG TERM GOAL #1  Title  be independent in advanced HEP    Time  8    Period  Weeks    Status  New    Target Date  10/11/19      PT LONG TERM GOAL #2   Title  reduce FOTO to < or = to 35% limitation    Time  8    Period  Weeks    Status  New    Target Date  10/11/19      PT LONG TERM GOAL #3   Title  demonstrate Lt shoulde A/ROM flexion to > or = to 140 degrees to improve overhead reaching    Time  8    Period  Weeks    Status  New    Target Date  10/11/19      PT LONG TERM GOAL #4   Title  demonstrate Lt shoulder IR to L1 to improve dressing    Time  8    Period  Weeks    Status  New     Target Date  10/11/19      PT LONG TERM GOAL #5   Title  report > or = to 60% reduction in Lt shoulder pain with use    Time  8    Period  Weeks    Status  New    Target Date  10/11/19             Plan - 08/16/19 1028    Clinical Impression Statement  Pt is a Rt hand dominant female who presents to PT with Lt shoulder pain of a chronic nature.  Pt reports a flare-up of pain over the past 2 months.  Pt reports 0/10 pain at rest and up to 5-6/10 pain with use. Pt demonstrates limited A/ROM and P/ROM in all directions with pain at end range.  Pt with forward head and rounded shoulder posture.  Strength is reduced although functional.  Pt with global tenderness and reduced Lt glenohumeral joint mobility in all directions.  Pt will benefit from skilled PT to address Lt shoulder A/ROM deficits, strength and postural strength.    Personal Factors and Comorbidities  Comorbidity 1    Comorbidities  Bil hip and knee replacements    Examination-Activity Limitations  Carry;Dressing    Examination-Participation Restrictions  Community Activity    Stability/Clinical Decision Making  Stable/Uncomplicated    Clinical Decision Making  Low    Rehab Potential  Excellent    PT Frequency  1x / week    PT Duration  8 weeks    PT Treatment/Interventions  ADLs/Self Care Home Management;Cryotherapy;Electrical Stimulation;Moist Heat;Iontophoresis 4mg /ml Dexamethasone;Functional mobility training;Therapeutic activities;Therapeutic exercise;Neuromuscular re-education;Manual techniques;Patient/family education;Passive range of motion;Dry needling;Taping    PT Next Visit Plan  P/ROM to Lt shoulder with mobs, build HEP for postural strength and shoulder ROM.  Pt only coming 1x/wk    PT Home Exercise Plan  Access Code: [email or web address]    Consulted and Agree with Plan of Care  Patient       Patient will benefit from skilled therapeutic intervention in order to improve the following deficits and impairments:  Decreased  activity tolerance, Decreased strength, Postural dysfunction, Impaired flexibility, Pain, Increased muscle spasms, Decreased range of motion, Decreased endurance  Visit Diagnosis: Chronic left shoulder pain - Plan: PT plan of care cert/re-cert  Stiffness of left shoulder, not elsewhere classified - Plan: PT plan of care cert/re-cert  Abnormal posture - Plan: PT plan of care cert/re-cert  Problem List Patient Active Problem List   Diagnosis Date Noted  . Atypical chest pain 02/14/2018  . Hyperlipidemia 02/14/2018     Lorrene Reid, PT 08/16/19 10:43 AM  [facility] [address], [address] [place], [place], [address] Phone: [phone]   Fax:  [phone]  Name: Lydia Osborne MRN: [id number] Date of Birth: 1950/10/22

## 2019-08-16 NOTE — Patient Instructions (Signed)
Access Code: [facility] URL: [email or web address] Date: 08/16/2019 Prepared by: Tresa Endo  Exercises Supine Shoulder Flexion AAROM with Hands Clasped - 3 x daily - 7 x weekly - 10 reps - 1 sets - 10 hold Seated Shoulder Flexion Towel Slide at Table Top - 3 x daily - 7 x weekly - 10 reps - 1 sets - 10 hold Seated Scapular Retraction - 3 x daily - 7 x weekly - 10 reps - 5 hold Seated Shoulder Flexion AAROM with Pulley Behind - 3 x daily - 7 x weekly Seated Shoulder Abduction AAROM with Pulley Behind - 3 x daily - 7 x weekly - 1 reps

## 2019-08-23 ENCOUNTER — Other Ambulatory Visit: Payer: Self-pay

## 2019-08-23 ENCOUNTER — Encounter: Payer: Self-pay | Admitting: Physical Therapy

## 2019-08-23 ENCOUNTER — Ambulatory Visit: Payer: Medicare Other | Admitting: Physical Therapy

## 2019-08-23 DIAGNOSIS — G8929 Other chronic pain: Secondary | ICD-10-CM | POA: Diagnosis not present

## 2019-08-23 DIAGNOSIS — R293 Abnormal posture: Secondary | ICD-10-CM

## 2019-08-23 DIAGNOSIS — M25612 Stiffness of left shoulder, not elsewhere classified: Secondary | ICD-10-CM | POA: Diagnosis not present

## 2019-08-23 DIAGNOSIS — M25512 Pain in left shoulder: Secondary | ICD-10-CM | POA: Diagnosis not present

## 2019-08-23 NOTE — Patient Instructions (Signed)
Access Code: [email or web address]: [email or web address]: 03/25/2021Prepared by: Loistine Simas BeuhringExercises  Supine Shoulder Flexion AAROM with Hands Clasped - 3 x daily - 7 x weekly - 10 reps - 1 sets - 10 hold  Seated Shoulder Flexion Towel Slide at Table Top - 3 x daily - 7 x weekly - 10 reps - 1 sets - 10 hold  Seated Scapular Retraction - 3 x daily - 7 x weekly - 10 reps - 5 hold  Seated Shoulder Flexion AAROM with Pulley Behind - 3 x daily - 7 x weekly  Seated Shoulder Abduction AAROM with Pulley Behind - 3 x daily - 7 x weekly - 1 reps  Supine Shoulder External Rotation with Resistance - 1 x daily - 7 x weekly - 2 sets - 10 reps  Supine Shoulder Circles with Weight - 1 x daily - 7 x weekly - 2 sets - 10 reps  Supine Shoulder Horizontal Abduction with Resistance - 1 x daily - 7 x weekly - 2 sets - 10 reps  Standing Row with Anchored Resistance - 1 x daily - 7 x weekly - 2 sets - 10 reps

## 2019-08-23 NOTE — Therapy (Addendum)
[facility] [address], [place], [place], [address] Phone: [phone]   Fax:  [phone]  Physical Therapy Treatment  Patient Details  Name: Lydia Osborne MRN: [id number] Date of Birth: [birth year] Referring Provider (PT): Lujean Amel, MD   Encounter Date: 08/23/2019  PT End of Session - 08/23/19 1017    Visit Number  2    Date for PT Re-Evaluation  10/11/19    Authorization Type  BCBS Medicare    PT Start Time  1012    PT Stop Time  1058    PT Time Calculation (min)  46 min    Activity Tolerance  Patient tolerated treatment well    Behavior During Therapy  [facility] for tasks assessed/performed       Past Medical History:  Diagnosis Date  . Graves' disease in remission   . Hyperlipidemia     Past Surgical History:  Procedure Laterality Date  . BREAST BIOPSY Right   . BREAST EXCISIONAL BIOPSY Left    x2    There were no vitals filed for this visit.  Subjective Assessment - 08/23/19 1015    Subjective  Movement helps the ROM but it continues to be painful.    Pertinent History  Bil hip and knees replaced.    Diagnostic tests  none    Patient Stated Goals  reduce shoulder pain    Currently in Pain?  Yes    Pain Score  3     Pain Location  Shoulder    Pain Orientation  Left    Pain Descriptors / Indicators  Sore    Pain Type  Chronic pain    Pain Onset  More than a month ago    Aggravating Factors   overhead use, lifting for work, sleep on Lt    Pain Relieving Factors  pulleys at home                       [facility] Adult PT Treatment/Exercise - 08/23/19 0001      Therapeutic Activites    Therapeutic Activities  Work Economist;Lifting    Lifting  squat with 10# box lift high handles to chest x 5    Work Simulation  sit to stand with dowel x 10      Exercises   Exercises  Shoulder;Neck      Neck Exercises: Machines for Strengthening   UBE (Upper Arm Bike)  L1.5 2x2, PT  present to discuss intiial HEP and symptoms      Shoulder Exercises: Supine   Horizontal ABduction  Strengthening;Both;10 reps;Theraband    Theraband Level (Shoulder Horizontal ABduction)  Level 1 (Yellow)    External Rotation  Strengthening;Both;10 reps;Theraband    Theraband Level (Shoulder External Rotation)  Level 1 (Yellow)    Flexion  AAROM;10 reps    Flexion Limitations  with dowel, PT cued neck retraction and scap fixation      Shoulder Exercises: Standing   Row  Strengthening;Both;15 reps;Theraband    Theraband Level (Shoulder Row)  Level 2 (Red)    Row Limitations  PT facil post glide of Lt humeral head which reduced pain      Shoulder Exercises: ROM/Strengthening   Rhythmic Stabilization, Supine  1# circles 2x10 each way with neck retraction      Shoulder Exercises: Stretch   Corner Stretch  1 rep;20 seconds    Corner Stretch Limitations  doorway (add to HEP next time)  Manual Therapy   Manual Therapy  Joint mobilization    Joint Mobilization  Lt [place] joint inf/post glides mob with P/ROM Gr II/III             PT Education - 08/23/19 1059    Education Details  Access Code: [facility]    Person(s) Educated  Patient    Methods  Explanation;Demonstration;Verbal cues;Handout    Comprehension  Verbalized understanding;Returned demonstration       PT Short Term Goals - 08/23/19 1059      PT SHORT TERM GOAL #1   Title  be indepenent in initial HEP    Status  On-going        PT Long Term Goals - 08/16/19 1017      PT LONG TERM GOAL #1   Title  be independent in advanced HEP    Time  8    Period  Weeks    Status  New    Target Date  10/11/19      PT LONG TERM GOAL #2   Title  reduce FOTO to < or = to 35% limitation    Time  8    Period  Weeks    Status  New    Target Date  10/11/19      PT LONG TERM GOAL #3   Title  demonstrate Lt shoulde A/ROM flexion to > or = to 140 degrees to improve overhead reaching    Time  8    Period  Weeks    Status   New    Target Date  10/11/19      PT LONG TERM GOAL #4   Title  demonstrate Lt shoulder IR to L1 to improve dressing    Time  8    Period  Weeks    Status  New    Target Date  10/11/19      PT LONG TERM GOAL #5   Title  report > or = to 60% reduction in Lt shoulder pain with use    Time  8    Period  Weeks    Status  New    Target Date  10/11/19            Plan - 08/23/19 1223    Clinical Impression Statement  Pt arrived with 2-3/10 pain.  Pain twinges occurred intermittently throughout session.  PT progressed HEP for postural awareness and strength with focus on scapular strength.  PT also covered functional strength coordination with task simulation for squatting, bending, lifting for tasks on farm.  Pt noted Lt shoulder pain with row which was abolished with PT's manual facil of full shoulder complex tracking to follow scapular retraction.  Pt will continue to benefit from targeted strength coordination and ROM to address Lt shoulder pain.    Comorbidities  Bil hip and knee replacements    PT Frequency  1x / week    PT Duration  8 weeks    PT Treatment/Interventions  ADLs/Self Care Home Management;Cryotherapy;Electrical Stimulation;Moist Heat;Iontophoresis 25m/ml Dexamethasone;Functional mobility training;Therapeutic activities;Therapeutic exercise;Neuromuscular re-education;Manual techniques;Patient/family education;Passive range of motion;Dry needling;Taping    PT Next Visit Plan  continue pec release, f/u on HEP, functional task simulation, try bent over row or prone row, prone scap retraction, prone ext, sit to stand, lifting with UQ posture focus    PT Home Exercise Plan  Access Code: [facility]   Consulted and Agree with Plan of Care  Patient       Patient will  benefit from skilled therapeutic intervention in order to improve the following deficits and impairments:     Visit Diagnosis: Chronic left shoulder pain  Stiffness of left shoulder, not elsewhere  classified  Abnormal posture     Problem List Patient Active Problem List   Diagnosis Date Noted  . Atypical chest pain 02/14/2018  . Hyperlipidemia 02/14/2018    Monish Haliburton, PT 08/23/19 12:28 PM  PHYSICAL THERAPY DISCHARGE SUMMARY  Visits from Start of Care: 2  Current functional level related to goals / functional outcomes: See above.  Pt only attended 2 PT sessions and did not return.   Remaining deficits: See above   Education / Equipment: HEP Plan: Patient agrees to discharge.  Patient goals were partially met. Patient is being discharged due to not returning since the last visit.  ?????         Baruch Merl, PT 10/18/19 12:13 PM    [facility] [address] [place], [place], [address] Phone: [phone]   Fax:  [phone]  Name: Raziah Funnell Osborne MRN: [id number] Date of Birth: January 09, 1951

## 2019-08-28 DIAGNOSIS — L821 Other seborrheic keratosis: Secondary | ICD-10-CM | POA: Diagnosis not present

## 2019-08-28 DIAGNOSIS — D225 Melanocytic nevi of trunk: Secondary | ICD-10-CM | POA: Diagnosis not present

## 2019-08-28 DIAGNOSIS — D1801 Hemangioma of skin and subcutaneous tissue: Secondary | ICD-10-CM | POA: Diagnosis not present

## 2019-08-28 DIAGNOSIS — D485 Neoplasm of uncertain behavior of skin: Secondary | ICD-10-CM | POA: Diagnosis not present

## 2019-08-30 ENCOUNTER — Encounter: Payer: Medicare Other | Admitting: Physical Therapy

## 2019-09-05 ENCOUNTER — Encounter: Payer: Medicare Other | Admitting: Physical Therapy

## 2019-09-11 DIAGNOSIS — K921 Melena: Secondary | ICD-10-CM | POA: Diagnosis not present

## 2019-09-12 DIAGNOSIS — E7800 Pure hypercholesterolemia, unspecified: Secondary | ICD-10-CM | POA: Diagnosis not present

## 2019-09-12 DIAGNOSIS — M9903 Segmental and somatic dysfunction of lumbar region: Secondary | ICD-10-CM | POA: Diagnosis not present

## 2019-09-12 DIAGNOSIS — E039 Hypothyroidism, unspecified: Secondary | ICD-10-CM | POA: Diagnosis not present

## 2019-09-12 DIAGNOSIS — M9904 Segmental and somatic dysfunction of sacral region: Secondary | ICD-10-CM | POA: Diagnosis not present

## 2019-09-12 DIAGNOSIS — M5136 Other intervertebral disc degeneration, lumbar region: Secondary | ICD-10-CM | POA: Diagnosis not present

## 2019-09-12 DIAGNOSIS — M9901 Segmental and somatic dysfunction of cervical region: Secondary | ICD-10-CM | POA: Diagnosis not present

## 2019-09-12 DIAGNOSIS — R5383 Other fatigue: Secondary | ICD-10-CM | POA: Diagnosis not present

## 2019-09-19 DIAGNOSIS — Z1159 Encounter for screening for other viral diseases: Secondary | ICD-10-CM | POA: Diagnosis not present

## 2019-09-20 ENCOUNTER — Other Ambulatory Visit: Payer: Self-pay

## 2019-09-20 ENCOUNTER — Emergency Department (HOSPITAL_COMMUNITY)
Admission: EM | Admit: 2019-09-20 | Discharge: 2019-09-20 | Disposition: A | Discharge status: Home / Self Care | Payer: Medicare Other | Attending: Emergency Medicine | Admitting: Emergency Medicine

## 2019-09-20 ENCOUNTER — Encounter (HOSPITAL_COMMUNITY): Payer: Self-pay | Admitting: Emergency Medicine

## 2019-09-20 DIAGNOSIS — S61452A Open bite of left hand, initial encounter: Secondary | ICD-10-CM | POA: Diagnosis not present

## 2019-09-20 DIAGNOSIS — Y999 Unspecified external cause status: Secondary | ICD-10-CM | POA: Insufficient documentation

## 2019-09-20 DIAGNOSIS — Y9389 Activity, other specified: Secondary | ICD-10-CM | POA: Insufficient documentation

## 2019-09-20 DIAGNOSIS — Y92009 Unspecified place in unspecified non-institutional (private) residence as the place of occurrence of the external cause: Secondary | ICD-10-CM | POA: Diagnosis not present

## 2019-09-20 DIAGNOSIS — S60571A Other superficial bite of hand of right hand, initial encounter: Secondary | ICD-10-CM | POA: Diagnosis not present

## 2019-09-20 DIAGNOSIS — S60872A Other superficial bite of left wrist, initial encounter: Secondary | ICD-10-CM | POA: Insufficient documentation

## 2019-09-20 DIAGNOSIS — Z23 Encounter for immunization: Secondary | ICD-10-CM | POA: Insufficient documentation

## 2019-09-20 DIAGNOSIS — S6992XA Unspecified injury of left wrist, hand and finger(s), initial encounter: Secondary | ICD-10-CM | POA: Diagnosis present

## 2019-09-20 DIAGNOSIS — Z79899 Other long term (current) drug therapy: Secondary | ICD-10-CM | POA: Diagnosis not present

## 2019-09-20 DIAGNOSIS — W540XXA Bitten by dog, initial encounter: Secondary | ICD-10-CM | POA: Diagnosis not present

## 2019-09-20 MED ORDER — TETANUS-DIPHTH-ACELL PERTUSSIS 5-2.5-18.5 LF-MCG/0.5 IM SUSP
0.5000 mL | Freq: Once | INTRAMUSCULAR | Status: AC
  Administered 2019-09-20: 0.5 mL via INTRAMUSCULAR
  Filled 2019-09-20: qty 0.5

## 2019-09-20 MED ORDER — AMOXICILLIN-POT CLAVULANATE 875-125 MG PO TABS: 1.0000 | ORAL_TABLET | Freq: Two times a day (BID) | ORAL | 0 refills | Status: AC

## 2019-09-20 NOTE — ED Triage Notes (Signed)
Pt states that her daughters two dogs got in a fight about food. She got bit on left wrist and right hand and right wrist when trying to separate them. Reports dog's shots are up to date. Unknown her last tetanus.

## 2019-09-20 NOTE — ED Notes (Signed)
Pt states her daughter recently adopted a new dog and today when bringing out food the dogs became territorial. She tried to intervene causing her to get attacked by the larger of the two animals. Pt suffered lacerations and bites to bilateral hands. Pt states both dogs are up to date on vaccines. Pt has full strength in both upper extremities and is able to move fingers freely. No active bleeding at this current moment.

## 2019-09-20 NOTE — ED Provider Notes (Signed)
[facility] DEPT Provider Note   CSN: [id number] Arrival date & time: 09/20/19  1708     History Chief Complaint  Patient presents with  . Animal Bite    Lydia Osborne is a 69 y.o. female.  Patient is a 68 year old female with past medical history of bladder dysfunction presenting for dog bite.  Patient was at her daughter's house today when her daughters to pet dog started fighting.  The larger dog then bit her as she was trying to intervene to protect the dog.  Dog is otherwise up-to-date on all vaccinations and does not have rabies as far she knows.  Denies any loss of consciousness.  Has been unable to move her extremities without difficulty.  Unknown tetanus status.  States otherwise dog is acting normally.  Is not on any blood thinners.        Past Medical History:  Diagnosis Date  . Graves' disease in remission   . Hyperlipidemia     Patient Active Problem List   Diagnosis Date Noted  . Atypical chest pain 02/14/2018  . Hyperlipidemia 02/14/2018    Past Surgical History:  Procedure Laterality Date  . BREAST BIOPSY Right   . BREAST EXCISIONAL BIOPSY Left    x2     OB History   No obstetric history on file.     Family History  Problem Relation Age of Onset  . Breast cancer Mother   . Thyroid disease Mother   . Heart disease Father   . Throat cancer Father     Social History   Tobacco Use  . Smoking status: Never Smoker  . Smokeless tobacco: Never Used  Substance Use Topics  . Alcohol use: Yes    Alcohol/week: 1.0 standard drinks    Types: 1 Cans of beer per week    Comment: monthly   . Drug use: Never    Home Medications Prior to Admission medications   Medication Sig Start Date End Date Taking? Authorizing Provider  amoxicillin-clavulanate (AUGMENTIN) 875-125 MG tablet Take 1 tablet by mouth 2 (two) times daily for 5 days. 09/20/19 09/25/19  Caroline More, DO  levothyroxine (SYNTHROID, LEVOTHROID)  125 MCG tablet Take 125 mcg by mouth daily. 01/02/18   [provider]  LORazepam (ATIVAN) 0.5 MG tablet Take 0.5 mg by mouth daily as needed for anxiety. 01/02/18   [provider]  rosuvastatin (CRESTOR) 5 MG tablet Take 1 tablet (5 mg total) by mouth daily. 03/14/19 06/12/19  Hilty, Nadean Corwin, MD    Allergies    Sulfa antibiotics  Review of Systems   Review of Systems  Musculoskeletal: Negative for arthralgias.  Neurological: Negative for syncope and weakness.    Physical Exam Updated Vital Signs BP (!) 142/75 (BP Location: Right Arm)   Pulse 81   Temp 98 F (36.7 C) (Oral)   Resp 18   Ht 5\' 7"  (1.702 m)   Wt 96.6 kg   SpO2 96%   BMI 33.36 kg/m   Physical Exam Constitutional:      General: She is not in acute distress.    Appearance: Normal appearance.  HENT:     Head: Normocephalic.     Nose: Nose normal.  Eyes:     Conjunctiva/sclera: Conjunctivae normal.  Musculoskeletal:        General: Tenderness present. Normal range of motion.     Comments: Normal grip strength. No pain to palpation of joints. Full ROM  Skin:    Capillary Refill:  Capillary refill takes less than 2 seconds.  Neurological:     General: No focal deficit present.     Mental Status: She is alert. Mental status is at baseline.  Psychiatric:        Mood and Affect: Mood normal.           ED Results / Procedures / Treatments   Labs (all labs ordered are listed, but only abnormal results are displayed) Labs Reviewed - No data to display  EKG None  Radiology No results found.  Procedures .Marland KitchenLaceration Repair  Date/Time: 09/20/2019 6:21 PM Performed by: Oralia Manis, DO Authorized by: Gerhard Munch, MD   Consent:    Consent obtained:  Verbal   Consent given by:  Patient   Risks discussed:  Infection and pain Anesthesia (see MAR for exact dosages):    Anesthesia method:  None Repair type:    Repair type:  Simple Treatment:    Area cleansed with:   Saline   Amount of cleaning:  Standard   Irrigation solution:  Sterile saline Skin repair:    Repair method:  Steri-Strips Approximation:    Approximation:  Loose Post-procedure details:    Dressing:  Non-adherent dressing   (including critical care time)  Medications Ordered in ED Medications  Tdap (BOOSTRIX) injection 0.5 mL (has no administration in time range)    ED Course  I have reviewed the triage vital signs and the nursing notes.  Pertinent labs & imaging results that were available during my care of the patient were reviewed by me and considered in my medical decision making (see chart for details).    MDM Rules/Calculators/A&P                      Patient presenting after dog bite. Dog is otherwise UTD on vaccinations and no known rabies history . Dog is a pet of a family member. Given that patient had several deep wounds decision was made to have some closure using steri strips. This way skin can approximate but still allow opening for infection prevention. Risk of infection discussed with patient. Will plan for prophylactic treatment with augmentin x5 days. Unknown tetanus status so will give tdap booster here in ED. ED return precautions discussed.   Patient discussed with Dr. Jeraldine Loots who also saw patient.   Final Clinical Impression(s) / ED Diagnoses Final diagnoses:  Dog bite, initial encounter    Rx / DC Orders ED Discharge Orders         Ordered    amoxicillin-clavulanate (AUGMENTIN) 875-125 MG tablet  2 times daily     09/20/19 1813           Oralia Manis, DO 09/20/19 Rickey Primus    Gerhard Munch, MD 09/24/19 0030

## 2019-09-24 DIAGNOSIS — K621 Rectal polyp: Secondary | ICD-10-CM | POA: Diagnosis not present

## 2019-09-24 DIAGNOSIS — K648 Other hemorrhoids: Secondary | ICD-10-CM | POA: Diagnosis not present

## 2019-09-24 DIAGNOSIS — K5730 Diverticulosis of large intestine without perforation or abscess without bleeding: Secondary | ICD-10-CM | POA: Diagnosis not present

## 2019-09-24 DIAGNOSIS — D123 Benign neoplasm of transverse colon: Secondary | ICD-10-CM | POA: Diagnosis not present

## 2019-09-24 DIAGNOSIS — D122 Benign neoplasm of ascending colon: Secondary | ICD-10-CM | POA: Diagnosis not present

## 2019-09-24 DIAGNOSIS — K625 Hemorrhage of anus and rectum: Secondary | ICD-10-CM | POA: Diagnosis not present

## 2019-09-26 ENCOUNTER — Encounter: Payer: Medicare Other | Admitting: Physical Therapy

## 2019-09-26 DIAGNOSIS — K621 Rectal polyp: Secondary | ICD-10-CM | POA: Diagnosis not present

## 2019-09-26 DIAGNOSIS — M9901 Segmental and somatic dysfunction of cervical region: Secondary | ICD-10-CM | POA: Diagnosis not present

## 2019-09-26 DIAGNOSIS — D123 Benign neoplasm of transverse colon: Secondary | ICD-10-CM | POA: Diagnosis not present

## 2019-09-26 DIAGNOSIS — M5136 Other intervertebral disc degeneration, lumbar region: Secondary | ICD-10-CM | POA: Diagnosis not present

## 2019-09-26 DIAGNOSIS — M9904 Segmental and somatic dysfunction of sacral region: Secondary | ICD-10-CM | POA: Diagnosis not present

## 2019-09-26 DIAGNOSIS — D122 Benign neoplasm of ascending colon: Secondary | ICD-10-CM | POA: Diagnosis not present

## 2019-09-26 DIAGNOSIS — M9903 Segmental and somatic dysfunction of lumbar region: Secondary | ICD-10-CM | POA: Diagnosis not present

## 2019-09-27 DIAGNOSIS — S61459D Open bite of unspecified hand, subsequent encounter: Secondary | ICD-10-CM | POA: Diagnosis not present

## 2019-09-27 DIAGNOSIS — W540XXD Bitten by dog, subsequent encounter: Secondary | ICD-10-CM | POA: Diagnosis not present

## 2019-10-24 DIAGNOSIS — M5136 Other intervertebral disc degeneration, lumbar region: Secondary | ICD-10-CM | POA: Diagnosis not present

## 2019-10-24 DIAGNOSIS — M9904 Segmental and somatic dysfunction of sacral region: Secondary | ICD-10-CM | POA: Diagnosis not present

## 2019-10-24 DIAGNOSIS — M9901 Segmental and somatic dysfunction of cervical region: Secondary | ICD-10-CM | POA: Diagnosis not present

## 2019-10-24 DIAGNOSIS — M9903 Segmental and somatic dysfunction of lumbar region: Secondary | ICD-10-CM | POA: Diagnosis not present

## 2019-11-14 DIAGNOSIS — M9903 Segmental and somatic dysfunction of lumbar region: Secondary | ICD-10-CM | POA: Diagnosis not present

## 2019-11-14 DIAGNOSIS — M5136 Other intervertebral disc degeneration, lumbar region: Secondary | ICD-10-CM | POA: Diagnosis not present

## 2019-11-14 DIAGNOSIS — M9904 Segmental and somatic dysfunction of sacral region: Secondary | ICD-10-CM | POA: Diagnosis not present

## 2019-11-14 DIAGNOSIS — M9901 Segmental and somatic dysfunction of cervical region: Secondary | ICD-10-CM | POA: Diagnosis not present

## 2019-11-27 ENCOUNTER — Telehealth: Payer: Self-pay | Admitting: Internal Medicine

## 2019-11-27 NOTE — Telephone Encounter (Signed)
Happy to see her back in the lipid clinic if she is interested to discuss other options for therapy.  Dr Rexene Edison

## 2019-11-27 NOTE — Telephone Encounter (Signed)
Dial Options 3 and 7       She needs to know if pt is still taking Rosuvastatin?

## 2019-11-27 NOTE — Telephone Encounter (Signed)
Spoke with the patient who states that she stopped taking Lydia Osborne about a month and a half ago due to it causing her muscle aches.  Patient also states that she is supposed to follow up with Lydia Osborne in August. I advised her that I would send a note to his scheduler to get that set up. Spoke with Delice Bison and she is aware.

## 2019-12-25 DIAGNOSIS — K047 Periapical abscess without sinus: Secondary | ICD-10-CM | POA: Diagnosis not present

## 2019-12-26 DIAGNOSIS — M62830 Muscle spasm of back: Secondary | ICD-10-CM | POA: Diagnosis not present

## 2019-12-26 DIAGNOSIS — M9903 Segmental and somatic dysfunction of lumbar region: Secondary | ICD-10-CM | POA: Diagnosis not present

## 2019-12-26 DIAGNOSIS — M9904 Segmental and somatic dysfunction of sacral region: Secondary | ICD-10-CM | POA: Diagnosis not present

## 2019-12-26 DIAGNOSIS — M545 Low back pain: Secondary | ICD-10-CM | POA: Diagnosis not present

## 2019-12-27 DIAGNOSIS — M9904 Segmental and somatic dysfunction of sacral region: Secondary | ICD-10-CM | POA: Diagnosis not present

## 2019-12-27 DIAGNOSIS — M9903 Segmental and somatic dysfunction of lumbar region: Secondary | ICD-10-CM | POA: Diagnosis not present

## 2019-12-27 DIAGNOSIS — M545 Low back pain: Secondary | ICD-10-CM | POA: Diagnosis not present

## 2019-12-27 DIAGNOSIS — M62830 Muscle spasm of back: Secondary | ICD-10-CM | POA: Diagnosis not present

## 2019-12-31 DIAGNOSIS — M9903 Segmental and somatic dysfunction of lumbar region: Secondary | ICD-10-CM | POA: Diagnosis not present

## 2019-12-31 DIAGNOSIS — M545 Low back pain: Secondary | ICD-10-CM | POA: Diagnosis not present

## 2019-12-31 DIAGNOSIS — M9904 Segmental and somatic dysfunction of sacral region: Secondary | ICD-10-CM | POA: Diagnosis not present

## 2019-12-31 DIAGNOSIS — M62830 Muscle spasm of back: Secondary | ICD-10-CM | POA: Diagnosis not present

## 2020-01-03 DIAGNOSIS — M62830 Muscle spasm of back: Secondary | ICD-10-CM | POA: Diagnosis not present

## 2020-01-03 DIAGNOSIS — M545 Low back pain: Secondary | ICD-10-CM | POA: Diagnosis not present

## 2020-01-03 DIAGNOSIS — M9903 Segmental and somatic dysfunction of lumbar region: Secondary | ICD-10-CM | POA: Diagnosis not present

## 2020-01-03 DIAGNOSIS — M9904 Segmental and somatic dysfunction of sacral region: Secondary | ICD-10-CM | POA: Diagnosis not present

## 2020-01-18 ENCOUNTER — Ambulatory Visit: Payer: Medicare Other | Admitting: Cardiovascular Disease

## 2020-01-24 DIAGNOSIS — M9901 Segmental and somatic dysfunction of cervical region: Secondary | ICD-10-CM | POA: Diagnosis not present

## 2020-01-24 DIAGNOSIS — M9903 Segmental and somatic dysfunction of lumbar region: Secondary | ICD-10-CM | POA: Diagnosis not present

## 2020-01-24 DIAGNOSIS — M9902 Segmental and somatic dysfunction of thoracic region: Secondary | ICD-10-CM | POA: Diagnosis not present

## 2020-01-24 DIAGNOSIS — M9905 Segmental and somatic dysfunction of pelvic region: Secondary | ICD-10-CM | POA: Diagnosis not present

## 2020-01-29 DIAGNOSIS — M9903 Segmental and somatic dysfunction of lumbar region: Secondary | ICD-10-CM | POA: Diagnosis not present

## 2020-01-29 DIAGNOSIS — M9901 Segmental and somatic dysfunction of cervical region: Secondary | ICD-10-CM | POA: Diagnosis not present

## 2020-01-29 DIAGNOSIS — M9902 Segmental and somatic dysfunction of thoracic region: Secondary | ICD-10-CM | POA: Diagnosis not present

## 2020-01-29 DIAGNOSIS — M9905 Segmental and somatic dysfunction of pelvic region: Secondary | ICD-10-CM | POA: Diagnosis not present

## 2020-02-05 DIAGNOSIS — M9901 Segmental and somatic dysfunction of cervical region: Secondary | ICD-10-CM | POA: Diagnosis not present

## 2020-02-05 DIAGNOSIS — M9902 Segmental and somatic dysfunction of thoracic region: Secondary | ICD-10-CM | POA: Diagnosis not present

## 2020-02-05 DIAGNOSIS — M9903 Segmental and somatic dysfunction of lumbar region: Secondary | ICD-10-CM | POA: Diagnosis not present

## 2020-02-05 DIAGNOSIS — M9905 Segmental and somatic dysfunction of pelvic region: Secondary | ICD-10-CM | POA: Diagnosis not present

## 2020-02-11 ENCOUNTER — Other Ambulatory Visit: Payer: Self-pay

## 2020-02-11 ENCOUNTER — Encounter: Payer: Self-pay | Admitting: Cardiovascular Disease

## 2020-02-11 ENCOUNTER — Ambulatory Visit: Payer: Medicare Other | Admitting: Cardiovascular Disease

## 2020-02-11 DIAGNOSIS — E782 Mixed hyperlipidemia: Secondary | ICD-10-CM | POA: Diagnosis not present

## 2020-02-11 DIAGNOSIS — R0789 Other chest pain: Secondary | ICD-10-CM | POA: Diagnosis not present

## 2020-02-11 NOTE — Patient Instructions (Signed)
Medication Instructions:  Your physician recommends that you continue on your current medications as directed. Please refer to the Current Medication list given to you today.  *If you need a refill on your cardiac medications before your next appointment, please call your pharmacy*  Lab Work: NONE ordered at this time of appointment   If you have labs (blood work) drawn today and your tests are completely normal, you will receive your results only by: Marland Kitchen MyChart Message (if you have MyChart) OR . A paper copy in the mail If you have any lab test that is abnormal or we need to change your treatment, we will call you to review the results.  Testing/Procedures: NONE ordered at this time of appointment   Follow-Up: At [facility], you and your health needs are our priority.  As part of our continuing mission to provide you with exceptional heart care, we have created designated Provider Care Teams.  These Care Teams include your primary Cardiologist (physician) and Advanced Practice Providers (APPs -  Physician Assistants and Nurse Practitioners) who all work together to provide you with the care you need, when you need it.  Your next appointment:   6-8 week(s) 1 year(s)   The format for your next appointment:   In Person In Person   Provider:   Kirtland Bouchard [place] Hilty, MD [facility] Nanetta Batty, MD  Other Instructions

## 2020-02-11 NOTE — Progress Notes (Signed)
02/11/2020 Lydia Osborne   01/04/1951  [id number]  Primary Physician Darrow Bussing, MD Primary Cardiologist: Runell Gess MD Nicholes Calamity, [place]  HPI:  Lydia Bagot Osborne is a 69 y.o.  moderately overweight married Caucasian female mother of 2, grandmother of 2 grandchildren referred by Dr. Lynelle Doctor in the emergency room for cardiovascular evaluation because of chest pain. She is retired from working in a group home.  I last saw her in the office 01/02/2019.Her risk factors include untreated hyperlipidemia attempting to improve with dietary modification and family history with father who had a myocardial infarction in his 52s. She is never had a heart attack or stroke. There is no history of GERD. She had chest pain the last several months occurring several times a month lasting hours at a time. Typically epigastric rating to her back without associated symptoms. Is not brought on by exertion, activity or food. She was seen in the emergency room on 01/14/2018 with the symptoms ruled out for myocardial infarction.  She had a Myoview stress test performed 02/21/2018 which was nonischemic.  Since I saw her in the office a year ago she is remained stable over denying symptoms of chest pain or shortness of breath.  She was seen by Dr. Rennis Golden in the lipid clinic 05/20/2019 and begun on low-dose rosuvastatin which resulted in marked improvement in her lipid profile performed 09/12/2019 with an LDL of 76.  She has since stopped this because of side effects.   Current Meds  Medication Sig  . levothyroxine (SYNTHROID, LEVOTHROID) 125 MCG tablet Take 125 mcg by mouth daily.  Marland Kitchen LORazepam (ATIVAN) 0.5 MG tablet Take 0.5 mg by mouth daily as needed for anxiety.     Allergies  Allergen Reactions  . Sulfa Antibiotics     Extended exposure causes itchiness    Social History   Socioeconomic History  . Marital status: Married    Spouse name: Not on file  . Number of children: Not  on file  . Years of education: Not on file  . Highest education level: Not on file  Occupational History  . Not on file  Tobacco Use  . Smoking status: Never Smoker  . Smokeless tobacco: Never Used  Vaping Use  . Vaping Use: Never used  Substance and Sexual Activity  . Alcohol use: Yes    Alcohol/week: 1.0 standard drink    Types: 1 Cans of beer per week    Comment: monthly   . Drug use: Never  . Sexual activity: Not on file  Other Topics Concern  . Not on file  Social History Narrative  . Not on file   Social Determinants of Health   Financial Resource Strain:   . Difficulty of Paying Living Expenses: Not on file  Food Insecurity:   . Worried About Programme researcher, broadcasting/film/video in the Last Year: Not on file  . Ran Out of Food in the Last Year: Not on file  Transportation Needs:   . Lack of Transportation (Medical): Not on file  . Lack of Transportation (Non-Medical): Not on file  Physical Activity:   . Days of Exercise per Week: Not on file  . Minutes of Exercise per Session: Not on file  Stress:   . Feeling of Stress : Not on file  Social Connections:   . Frequency of Communication with Friends and Family: Not on file  . Frequency of Social Gatherings with Friends and Family: Not on file  . Attends  Religious Services: Not on file  . Active Member of Clubs or Organizations: Not on file  . Attends Banker Meetings: Not on file  . Marital Status: Not on file  Intimate Partner Violence:   . Fear of Current or Ex-Partner: Not on file  . Emotionally Abused: Not on file  . Physically Abused: Not on file  . Sexually Abused: Not on file     Review of Systems: General: negative for chills, fever, night sweats or weight changes.  Cardiovascular: negative for chest pain, dyspnea on exertion, edema, orthopnea, palpitations, paroxysmal nocturnal dyspnea or shortness of breath Dermatological: negative for rash Respiratory: negative for cough or wheezing Urologic:  negative for hematuria Abdominal: negative for nausea, vomiting, diarrhea, bright red blood per rectum, melena, or hematemesis Neurologic: negative for visual changes, syncope, or dizziness All other systems reviewed and are otherwise negative except as noted above.    Blood pressure 102/60, pulse 71, height 5\' 7"  (1.702 m), weight 210 lb (95.3 kg).  General appearance: alert and no distress Neck: no adenopathy, no carotid bruit, no JVD, supple, symmetrical, trachea midline and thyroid not enlarged, symmetric, no tenderness/mass/nodules Lungs: clear to auscultation bilaterally Heart: regular rate and rhythm, S1, S2 normal, no murmur, click, rub or gallop Extremities: extremities normal, atraumatic, no cyanosis or edema Pulses: 2+ and symmetric Skin: Skin color, texture, turgor normal. No rashes or lesions Neurologic: Alert and oriented X 3, normal strength and tone. Normal symmetric reflexes. Normal coordination and gait  EKG sinus rhythm at 71 with nonspecific ST and T wave changes.  I personally reviewed this EKG.  ASSESSMENT AND PLAN:   Atypical chest pain History of atypical chest pain in the past with a negative Myoview stress test 2 years ago and a coronary calcium score of 37 performed 03/13/2019.  She has had no recurrent symptoms.  Hyperlipidemia Patient hyperlipidemia intolerant to statin therapy although she was placed on low-dose rosuvastatin by Dr. 03/15/2019 with lipid profile performed 09/12/2019 revealing total cholesterol 171, LDL 76 and HDL of 68.  She has since stopped her statin drug because of intolerance.  I am referring her back to Dr. 09/14/2019 for consideration of beginning a PCSK9.      Rennis Golden MD FACP,FACC,FAHA, [facility] 02/11/2020 8:03 AM

## 2020-02-11 NOTE — Assessment & Plan Note (Signed)
Patient hyperlipidemia intolerant to statin therapy although she was placed on low-dose rosuvastatin by Dr. Rennis Golden with lipid profile performed 09/12/2019 revealing total cholesterol 171, LDL 76 and HDL of 68.  She has since stopped her statin drug because of intolerance.  I am referring her back to Dr. Rennis Golden for consideration of beginning a PCSK9.

## 2020-02-11 NOTE — Assessment & Plan Note (Signed)
History of atypical chest pain in the past with a negative Myoview stress test 2 years ago and a coronary calcium score of 37 performed 03/13/2019.  She has had no recurrent symptoms.

## 2020-02-13 DIAGNOSIS — M9901 Segmental and somatic dysfunction of cervical region: Secondary | ICD-10-CM | POA: Diagnosis not present

## 2020-02-13 DIAGNOSIS — M9905 Segmental and somatic dysfunction of pelvic region: Secondary | ICD-10-CM | POA: Diagnosis not present

## 2020-02-13 DIAGNOSIS — M9903 Segmental and somatic dysfunction of lumbar region: Secondary | ICD-10-CM | POA: Diagnosis not present

## 2020-02-13 DIAGNOSIS — M9902 Segmental and somatic dysfunction of thoracic region: Secondary | ICD-10-CM | POA: Diagnosis not present

## 2020-02-18 DIAGNOSIS — M532X7 Spinal instabilities, lumbosacral region: Secondary | ICD-10-CM | POA: Diagnosis not present

## 2020-02-21 DIAGNOSIS — M532X7 Spinal instabilities, lumbosacral region: Secondary | ICD-10-CM | POA: Diagnosis not present

## 2020-02-25 DIAGNOSIS — M532X7 Spinal instabilities, lumbosacral region: Secondary | ICD-10-CM | POA: Diagnosis not present

## 2020-02-28 DIAGNOSIS — M532X7 Spinal instabilities, lumbosacral region: Secondary | ICD-10-CM | POA: Diagnosis not present

## 2020-03-07 DIAGNOSIS — M532X7 Spinal instabilities, lumbosacral region: Secondary | ICD-10-CM | POA: Diagnosis not present

## 2020-03-10 DIAGNOSIS — M532X7 Spinal instabilities, lumbosacral region: Secondary | ICD-10-CM | POA: Diagnosis not present

## 2020-03-13 DIAGNOSIS — M532X7 Spinal instabilities, lumbosacral region: Secondary | ICD-10-CM | POA: Diagnosis not present

## 2020-03-18 DIAGNOSIS — Z0001 Encounter for general adult medical examination with abnormal findings: Secondary | ICD-10-CM | POA: Diagnosis not present

## 2020-03-18 DIAGNOSIS — E7800 Pure hypercholesterolemia, unspecified: Secondary | ICD-10-CM | POA: Diagnosis not present

## 2020-03-18 DIAGNOSIS — F321 Major depressive disorder, single episode, moderate: Secondary | ICD-10-CM | POA: Diagnosis not present

## 2020-03-18 DIAGNOSIS — E039 Hypothyroidism, unspecified: Secondary | ICD-10-CM | POA: Diagnosis not present

## 2020-03-19 DIAGNOSIS — M532X7 Spinal instabilities, lumbosacral region: Secondary | ICD-10-CM | POA: Diagnosis not present

## 2020-03-27 DIAGNOSIS — M532X7 Spinal instabilities, lumbosacral region: Secondary | ICD-10-CM | POA: Diagnosis not present

## 2020-03-31 DIAGNOSIS — M9901 Segmental and somatic dysfunction of cervical region: Secondary | ICD-10-CM | POA: Diagnosis not present

## 2020-03-31 DIAGNOSIS — M9902 Segmental and somatic dysfunction of thoracic region: Secondary | ICD-10-CM | POA: Diagnosis not present

## 2020-03-31 DIAGNOSIS — M9903 Segmental and somatic dysfunction of lumbar region: Secondary | ICD-10-CM | POA: Diagnosis not present

## 2020-03-31 DIAGNOSIS — M9905 Segmental and somatic dysfunction of pelvic region: Secondary | ICD-10-CM | POA: Diagnosis not present

## 2020-04-03 DIAGNOSIS — M532X7 Spinal instabilities, lumbosacral region: Secondary | ICD-10-CM | POA: Diagnosis not present

## 2020-04-10 DIAGNOSIS — M532X7 Spinal instabilities, lumbosacral region: Secondary | ICD-10-CM | POA: Diagnosis not present

## 2020-04-11 DIAGNOSIS — M7502 Adhesive capsulitis of left shoulder: Secondary | ICD-10-CM | POA: Diagnosis not present

## 2020-04-11 DIAGNOSIS — M7542 Impingement syndrome of left shoulder: Secondary | ICD-10-CM | POA: Diagnosis not present

## 2020-04-11 DIAGNOSIS — M75102 Unspecified rotator cuff tear or rupture of left shoulder, not specified as traumatic: Secondary | ICD-10-CM | POA: Diagnosis not present

## 2020-04-11 DIAGNOSIS — S46012A Strain of muscle(s) and tendon(s) of the rotator cuff of left shoulder, initial encounter: Secondary | ICD-10-CM | POA: Diagnosis not present

## 2020-04-14 DIAGNOSIS — M532X7 Spinal instabilities, lumbosacral region: Secondary | ICD-10-CM | POA: Diagnosis not present

## 2020-04-23 DIAGNOSIS — M532X7 Spinal instabilities, lumbosacral region: Secondary | ICD-10-CM | POA: Diagnosis not present

## 2020-04-29 DIAGNOSIS — M532X7 Spinal instabilities, lumbosacral region: Secondary | ICD-10-CM | POA: Diagnosis not present

## 2020-05-06 DIAGNOSIS — M532X7 Spinal instabilities, lumbosacral region: Secondary | ICD-10-CM | POA: Diagnosis not present

## 2020-05-07 ENCOUNTER — Other Ambulatory Visit: Payer: Self-pay | Admitting: Family Medicine

## 2020-05-07 DIAGNOSIS — R911 Solitary pulmonary nodule: Secondary | ICD-10-CM

## 2020-05-09 ENCOUNTER — Ambulatory Visit: Payer: Medicare Other | Admitting: Internal Medicine

## 2020-05-20 DIAGNOSIS — M532X7 Spinal instabilities, lumbosacral region: Secondary | ICD-10-CM | POA: Diagnosis not present

## 2020-05-26 DIAGNOSIS — M532X7 Spinal instabilities, lumbosacral region: Secondary | ICD-10-CM | POA: Diagnosis not present

## 2020-05-26 DIAGNOSIS — M7502 Adhesive capsulitis of left shoulder: Secondary | ICD-10-CM | POA: Diagnosis not present

## 2020-05-26 DIAGNOSIS — M7542 Impingement syndrome of left shoulder: Secondary | ICD-10-CM | POA: Diagnosis not present

## 2020-05-26 DIAGNOSIS — M75102 Unspecified rotator cuff tear or rupture of left shoulder, not specified as traumatic: Secondary | ICD-10-CM | POA: Diagnosis not present

## 2020-05-26 DIAGNOSIS — S46012A Strain of muscle(s) and tendon(s) of the rotator cuff of left shoulder, initial encounter: Secondary | ICD-10-CM | POA: Diagnosis not present

## 2020-05-27 ENCOUNTER — Ambulatory Visit
Admission: RE | Admit: 2020-05-27 | Discharge: 2020-05-27 | Disposition: A | Discharge status: Home / Self Care | Payer: Medicare Other | Source: Ambulatory Visit | Attending: Family Medicine | Admitting: Family Medicine

## 2020-05-27 ENCOUNTER — Other Ambulatory Visit: Payer: Self-pay

## 2020-05-27 DIAGNOSIS — R911 Solitary pulmonary nodule: Secondary | ICD-10-CM

## 2020-05-27 DIAGNOSIS — R918 Other nonspecific abnormal finding of lung field: Secondary | ICD-10-CM | POA: Diagnosis not present

## 2020-06-14 DIAGNOSIS — Z20822 Contact with and (suspected) exposure to covid-19: Secondary | ICD-10-CM | POA: Diagnosis not present

## 2020-07-17 DIAGNOSIS — M532X7 Spinal instabilities, lumbosacral region: Secondary | ICD-10-CM | POA: Diagnosis not present

## 2020-07-22 DIAGNOSIS — M532X7 Spinal instabilities, lumbosacral region: Secondary | ICD-10-CM | POA: Diagnosis not present

## 2020-07-23 DIAGNOSIS — M9901 Segmental and somatic dysfunction of cervical region: Secondary | ICD-10-CM | POA: Diagnosis not present

## 2020-07-23 DIAGNOSIS — M9902 Segmental and somatic dysfunction of thoracic region: Secondary | ICD-10-CM | POA: Diagnosis not present

## 2020-07-23 DIAGNOSIS — M9903 Segmental and somatic dysfunction of lumbar region: Secondary | ICD-10-CM | POA: Diagnosis not present

## 2020-07-23 DIAGNOSIS — M9905 Segmental and somatic dysfunction of pelvic region: Secondary | ICD-10-CM | POA: Diagnosis not present

## 2020-08-01 DIAGNOSIS — M532X7 Spinal instabilities, lumbosacral region: Secondary | ICD-10-CM | POA: Diagnosis not present

## 2020-08-08 DIAGNOSIS — M532X7 Spinal instabilities, lumbosacral region: Secondary | ICD-10-CM | POA: Diagnosis not present

## 2020-08-28 DIAGNOSIS — L570 Actinic keratosis: Secondary | ICD-10-CM | POA: Diagnosis not present

## 2020-08-28 DIAGNOSIS — L821 Other seborrheic keratosis: Secondary | ICD-10-CM | POA: Diagnosis not present

## 2020-08-28 DIAGNOSIS — D225 Melanocytic nevi of trunk: Secondary | ICD-10-CM | POA: Diagnosis not present

## 2020-08-28 DIAGNOSIS — L814 Other melanin hyperpigmentation: Secondary | ICD-10-CM | POA: Diagnosis not present

## 2020-09-24 DIAGNOSIS — M532X7 Spinal instabilities, lumbosacral region: Secondary | ICD-10-CM | POA: Diagnosis not present

## 2020-10-07 DIAGNOSIS — M532X7 Spinal instabilities, lumbosacral region: Secondary | ICD-10-CM | POA: Diagnosis not present

## 2020-10-15 DIAGNOSIS — M532X7 Spinal instabilities, lumbosacral region: Secondary | ICD-10-CM | POA: Diagnosis not present

## 2020-10-28 DIAGNOSIS — R222 Localized swelling, mass and lump, trunk: Secondary | ICD-10-CM | POA: Diagnosis not present

## 2020-11-07 DIAGNOSIS — H2513 Age-related nuclear cataract, bilateral: Secondary | ICD-10-CM | POA: Diagnosis not present

## 2020-11-07 DIAGNOSIS — H43813 Vitreous degeneration, bilateral: Secondary | ICD-10-CM | POA: Diagnosis not present

## 2020-11-07 DIAGNOSIS — H524 Presbyopia: Secondary | ICD-10-CM | POA: Diagnosis not present

## 2020-11-07 DIAGNOSIS — H35363 Drusen (degenerative) of macula, bilateral: Secondary | ICD-10-CM | POA: Diagnosis not present

## 2020-11-10 DIAGNOSIS — H524 Presbyopia: Secondary | ICD-10-CM | POA: Diagnosis not present

## 2020-12-08 DIAGNOSIS — M545 Low back pain, unspecified: Secondary | ICD-10-CM | POA: Diagnosis not present

## 2020-12-30 ENCOUNTER — Ambulatory Visit (HOSPITAL_BASED_OUTPATIENT_CLINIC_OR_DEPARTMENT_OTHER)
Admission: RE | Admit: 2020-12-30 | Discharge: 2020-12-30 | Disposition: A | Discharge status: Home / Self Care | Payer: Medicare Other | Source: Ambulatory Visit | Attending: Family Medicine | Admitting: Family Medicine

## 2020-12-30 ENCOUNTER — Other Ambulatory Visit (HOSPITAL_BASED_OUTPATIENT_CLINIC_OR_DEPARTMENT_OTHER): Payer: Self-pay | Admitting: Family Medicine

## 2020-12-30 ENCOUNTER — Other Ambulatory Visit: Payer: Self-pay

## 2020-12-30 DIAGNOSIS — Z1231 Encounter for screening mammogram for malignant neoplasm of breast: Secondary | ICD-10-CM | POA: Insufficient documentation

## 2021-03-10 DIAGNOSIS — M545 Low back pain, unspecified: Secondary | ICD-10-CM | POA: Diagnosis not present

## 2021-03-13 DIAGNOSIS — M545 Low back pain, unspecified: Secondary | ICD-10-CM | POA: Diagnosis not present

## 2021-03-18 DIAGNOSIS — M545 Low back pain, unspecified: Secondary | ICD-10-CM | POA: Diagnosis not present

## 2021-03-20 DIAGNOSIS — F419 Anxiety disorder, unspecified: Secondary | ICD-10-CM | POA: Diagnosis not present

## 2021-03-20 DIAGNOSIS — E039 Hypothyroidism, unspecified: Secondary | ICD-10-CM | POA: Diagnosis not present

## 2021-03-20 DIAGNOSIS — Z79899 Other long term (current) drug therapy: Secondary | ICD-10-CM | POA: Diagnosis not present

## 2021-03-20 DIAGNOSIS — Z0001 Encounter for general adult medical examination with abnormal findings: Secondary | ICD-10-CM | POA: Diagnosis not present

## 2021-03-27 DIAGNOSIS — M545 Low back pain, unspecified: Secondary | ICD-10-CM | POA: Diagnosis not present

## 2021-03-31 DIAGNOSIS — Z79899 Other long term (current) drug therapy: Secondary | ICD-10-CM | POA: Diagnosis not present

## 2021-03-31 DIAGNOSIS — E7800 Pure hypercholesterolemia, unspecified: Secondary | ICD-10-CM | POA: Diagnosis not present

## 2021-03-31 DIAGNOSIS — Z113 Encounter for screening for infections with a predominantly sexual mode of transmission: Secondary | ICD-10-CM | POA: Diagnosis not present

## 2021-03-31 DIAGNOSIS — E039 Hypothyroidism, unspecified: Secondary | ICD-10-CM | POA: Diagnosis not present

## 2021-03-31 DIAGNOSIS — R7309 Other abnormal glucose: Secondary | ICD-10-CM | POA: Diagnosis not present

## 2021-04-09 DIAGNOSIS — M545 Low back pain, unspecified: Secondary | ICD-10-CM | POA: Diagnosis not present

## 2021-04-12 IMAGING — MG DIGITAL SCREENING BILATERAL MAMMOGRAM WITH TOMO AND CAD
6 of 12 series · 6 of 36 positions shown · non-contrast
Comparison: Previous exam(s).

CLINICAL DATA: Screening.

EXAM:
DIGITAL SCREENING BILATERAL MAMMOGRAM WITH TOMO AND CAD

[R MLO synth-2D (1 of 2)]
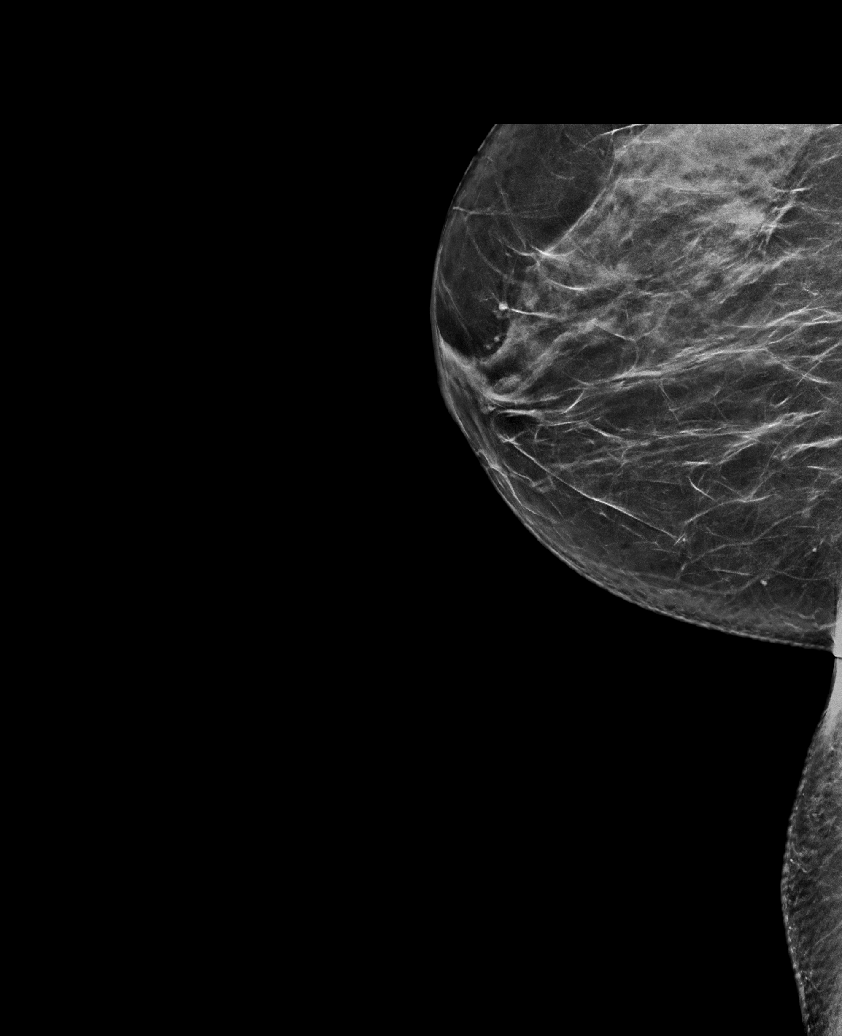

[L CC synth-2D]
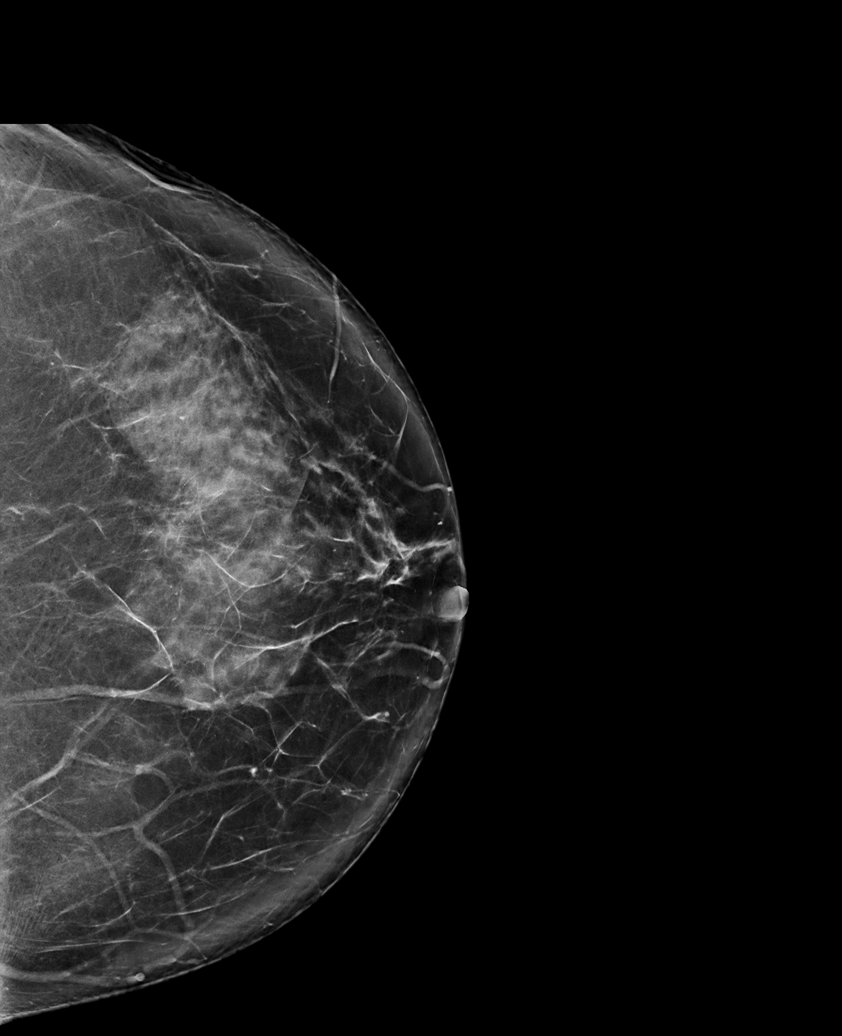

[R CC synth-2D]
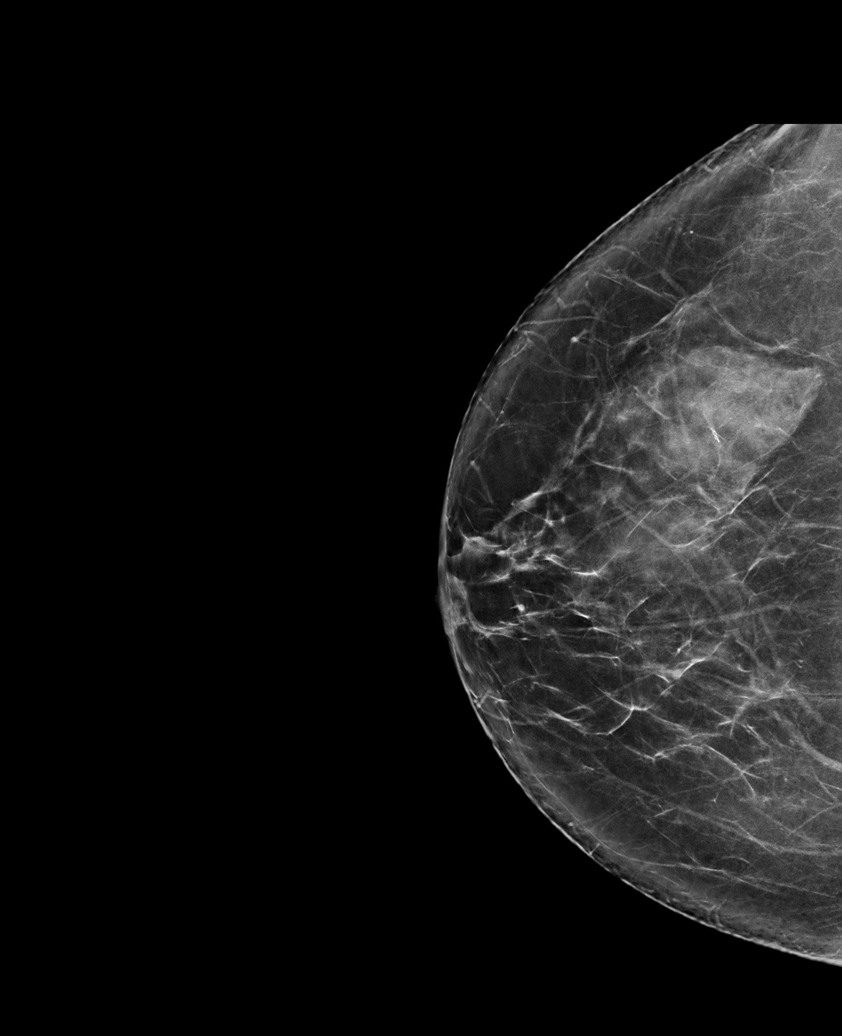

[L MLO synth-2D (1 of 2)]
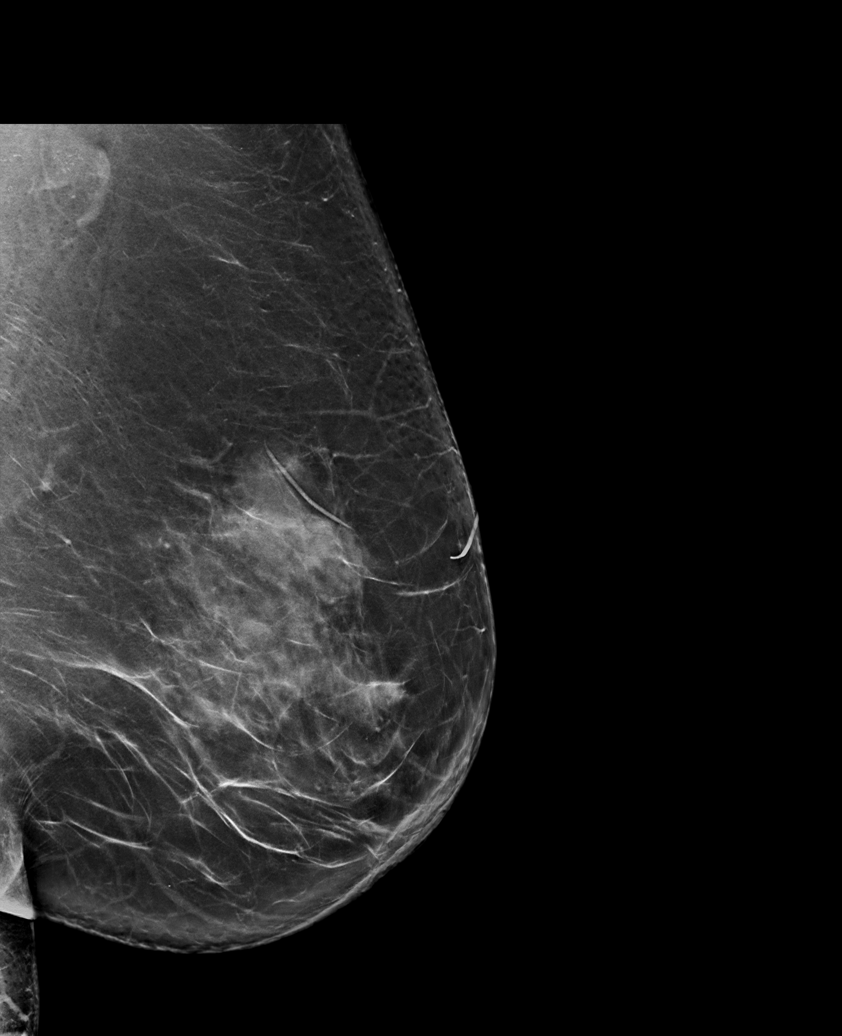

[L MLO synth-2D (2 of 2)]
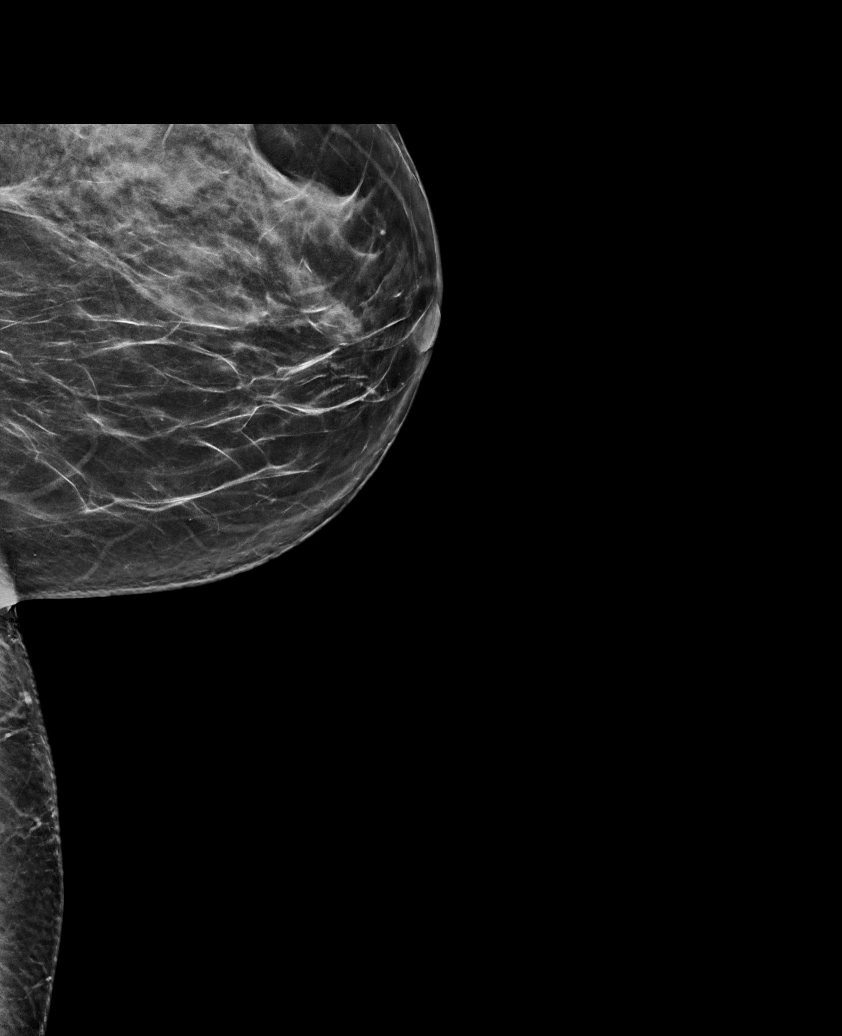

[R MLO synth-2D (2 of 2)]
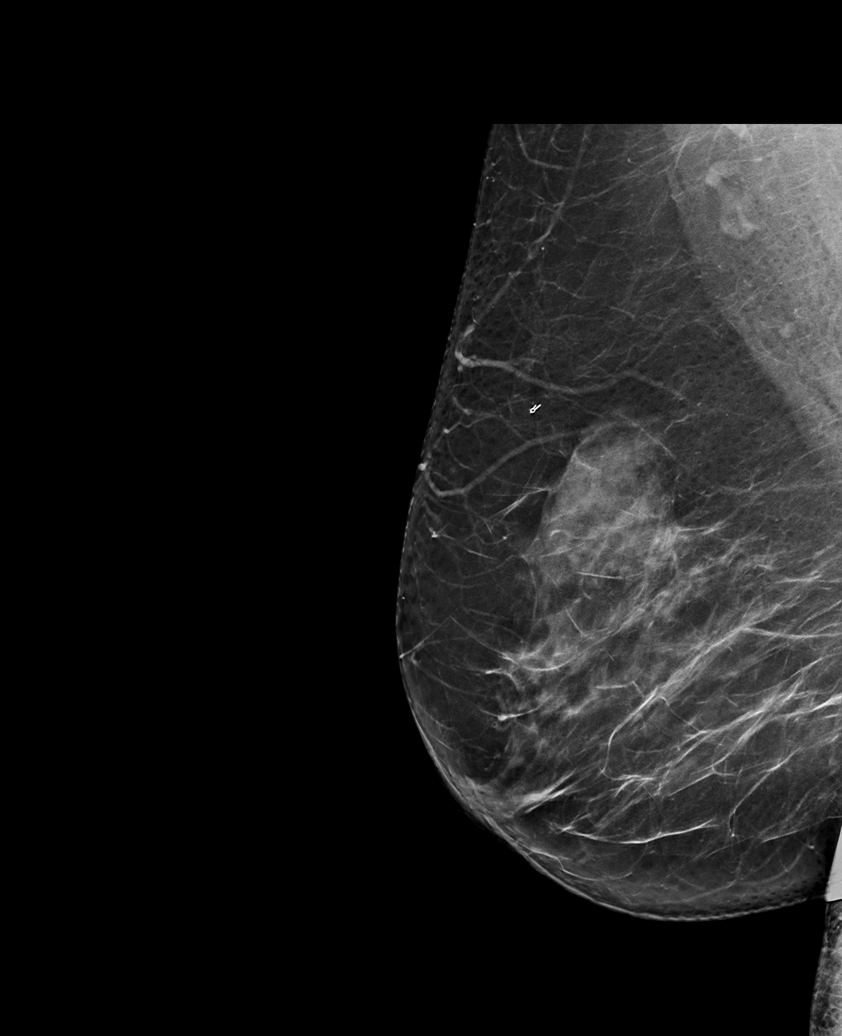

[6 of 36 positions shown; findings below may reference images not displayed]

ACR Breast Density Category c: The breast tissue is heterogeneously
dense, which may obscure small masses.
FINDINGS: There are no findings suspicious for malignancy. Images were
processed with CAD.
IMPRESSION: No mammographic evidence of malignancy. A result letter of this
screening mammogram will be mailed directly to the patient.

RECOMMENDATION:
Screening mammogram in one year. (Code:FT-U-LHB)

BI-RADS CATEGORY  1: Negative.

## 2021-04-17 DIAGNOSIS — M545 Low back pain, unspecified: Secondary | ICD-10-CM | POA: Diagnosis not present

## 2021-04-20 DIAGNOSIS — M545 Low back pain, unspecified: Secondary | ICD-10-CM | POA: Diagnosis not present

## 2021-04-27 DIAGNOSIS — M545 Low back pain, unspecified: Secondary | ICD-10-CM | POA: Diagnosis not present

## 2021-05-21 DIAGNOSIS — M545 Low back pain, unspecified: Secondary | ICD-10-CM | POA: Diagnosis not present

## 2021-05-27 DIAGNOSIS — M545 Low back pain, unspecified: Secondary | ICD-10-CM | POA: Diagnosis not present

## 2021-06-05 DIAGNOSIS — M545 Low back pain, unspecified: Secondary | ICD-10-CM | POA: Diagnosis not present

## 2021-06-11 DIAGNOSIS — M545 Low back pain, unspecified: Secondary | ICD-10-CM | POA: Diagnosis not present

## 2021-06-22 DIAGNOSIS — M545 Low back pain, unspecified: Secondary | ICD-10-CM | POA: Diagnosis not present

## 2021-07-08 DIAGNOSIS — M545 Low back pain, unspecified: Secondary | ICD-10-CM | POA: Diagnosis not present

## 2021-07-20 DIAGNOSIS — M545 Low back pain, unspecified: Secondary | ICD-10-CM | POA: Diagnosis not present

## 2021-07-23 DIAGNOSIS — S76012A Strain of muscle, fascia and tendon of left hip, initial encounter: Secondary | ICD-10-CM | POA: Diagnosis not present

## 2021-07-23 DIAGNOSIS — S76011A Strain of muscle, fascia and tendon of right hip, initial encounter: Secondary | ICD-10-CM | POA: Diagnosis not present

## 2021-07-23 DIAGNOSIS — M25551 Pain in right hip: Secondary | ICD-10-CM | POA: Diagnosis not present

## 2021-08-14 DIAGNOSIS — M25551 Pain in right hip: Secondary | ICD-10-CM | POA: Diagnosis not present

## 2021-08-25 DIAGNOSIS — L814 Other melanin hyperpigmentation: Secondary | ICD-10-CM | POA: Diagnosis not present

## 2021-08-25 DIAGNOSIS — L280 Lichen simplex chronicus: Secondary | ICD-10-CM | POA: Diagnosis not present

## 2021-08-25 DIAGNOSIS — D225 Melanocytic nevi of trunk: Secondary | ICD-10-CM | POA: Diagnosis not present

## 2021-08-25 DIAGNOSIS — L821 Other seborrheic keratosis: Secondary | ICD-10-CM | POA: Diagnosis not present

## 2021-08-28 DIAGNOSIS — M25651 Stiffness of right hip, not elsewhere classified: Secondary | ICD-10-CM | POA: Diagnosis not present

## 2021-08-28 DIAGNOSIS — M25551 Pain in right hip: Secondary | ICD-10-CM | POA: Diagnosis not present

## 2021-08-28 DIAGNOSIS — R293 Abnormal posture: Secondary | ICD-10-CM | POA: Diagnosis not present

## 2021-08-28 DIAGNOSIS — R2689 Other abnormalities of gait and mobility: Secondary | ICD-10-CM | POA: Diagnosis not present

## 2021-09-07 DIAGNOSIS — R293 Abnormal posture: Secondary | ICD-10-CM | POA: Diagnosis not present

## 2021-09-07 DIAGNOSIS — M25551 Pain in right hip: Secondary | ICD-10-CM | POA: Diagnosis not present

## 2021-09-07 DIAGNOSIS — R2689 Other abnormalities of gait and mobility: Secondary | ICD-10-CM | POA: Diagnosis not present

## 2021-09-07 DIAGNOSIS — M25651 Stiffness of right hip, not elsewhere classified: Secondary | ICD-10-CM | POA: Diagnosis not present

## 2021-09-14 DIAGNOSIS — R2689 Other abnormalities of gait and mobility: Secondary | ICD-10-CM | POA: Diagnosis not present

## 2021-09-14 DIAGNOSIS — M25651 Stiffness of right hip, not elsewhere classified: Secondary | ICD-10-CM | POA: Diagnosis not present

## 2021-09-14 DIAGNOSIS — R293 Abnormal posture: Secondary | ICD-10-CM | POA: Diagnosis not present

## 2021-09-14 DIAGNOSIS — M25551 Pain in right hip: Secondary | ICD-10-CM | POA: Diagnosis not present

## 2021-09-17 DIAGNOSIS — R293 Abnormal posture: Secondary | ICD-10-CM | POA: Diagnosis not present

## 2021-09-17 DIAGNOSIS — R2689 Other abnormalities of gait and mobility: Secondary | ICD-10-CM | POA: Diagnosis not present

## 2021-09-17 DIAGNOSIS — M25651 Stiffness of right hip, not elsewhere classified: Secondary | ICD-10-CM | POA: Diagnosis not present

## 2021-09-17 DIAGNOSIS — M25551 Pain in right hip: Secondary | ICD-10-CM | POA: Diagnosis not present

## 2021-09-24 DIAGNOSIS — M25651 Stiffness of right hip, not elsewhere classified: Secondary | ICD-10-CM | POA: Diagnosis not present

## 2021-09-24 DIAGNOSIS — R2689 Other abnormalities of gait and mobility: Secondary | ICD-10-CM | POA: Diagnosis not present

## 2021-09-24 DIAGNOSIS — R293 Abnormal posture: Secondary | ICD-10-CM | POA: Diagnosis not present

## 2021-09-24 DIAGNOSIS — M25551 Pain in right hip: Secondary | ICD-10-CM | POA: Diagnosis not present

## 2021-09-28 DIAGNOSIS — M25651 Stiffness of right hip, not elsewhere classified: Secondary | ICD-10-CM | POA: Diagnosis not present

## 2021-09-28 DIAGNOSIS — R293 Abnormal posture: Secondary | ICD-10-CM | POA: Diagnosis not present

## 2021-09-28 DIAGNOSIS — M25551 Pain in right hip: Secondary | ICD-10-CM | POA: Diagnosis not present

## 2021-09-28 DIAGNOSIS — R2689 Other abnormalities of gait and mobility: Secondary | ICD-10-CM | POA: Diagnosis not present

## 2021-10-02 DIAGNOSIS — R2689 Other abnormalities of gait and mobility: Secondary | ICD-10-CM | POA: Diagnosis not present

## 2021-10-02 DIAGNOSIS — M25651 Stiffness of right hip, not elsewhere classified: Secondary | ICD-10-CM | POA: Diagnosis not present

## 2021-10-02 DIAGNOSIS — M25551 Pain in right hip: Secondary | ICD-10-CM | POA: Diagnosis not present

## 2021-10-02 DIAGNOSIS — R293 Abnormal posture: Secondary | ICD-10-CM | POA: Diagnosis not present

## 2021-10-05 DIAGNOSIS — M25551 Pain in right hip: Secondary | ICD-10-CM | POA: Diagnosis not present

## 2021-10-05 DIAGNOSIS — R2689 Other abnormalities of gait and mobility: Secondary | ICD-10-CM | POA: Diagnosis not present

## 2021-10-05 DIAGNOSIS — M25651 Stiffness of right hip, not elsewhere classified: Secondary | ICD-10-CM | POA: Diagnosis not present

## 2021-10-05 DIAGNOSIS — R293 Abnormal posture: Secondary | ICD-10-CM | POA: Diagnosis not present

## 2021-10-07 DIAGNOSIS — M25651 Stiffness of right hip, not elsewhere classified: Secondary | ICD-10-CM | POA: Diagnosis not present

## 2021-10-07 DIAGNOSIS — M25551 Pain in right hip: Secondary | ICD-10-CM | POA: Diagnosis not present

## 2021-10-07 DIAGNOSIS — R293 Abnormal posture: Secondary | ICD-10-CM | POA: Diagnosis not present

## 2021-10-07 DIAGNOSIS — R2689 Other abnormalities of gait and mobility: Secondary | ICD-10-CM | POA: Diagnosis not present

## 2021-10-09 DIAGNOSIS — M25651 Stiffness of right hip, not elsewhere classified: Secondary | ICD-10-CM | POA: Diagnosis not present

## 2021-10-09 DIAGNOSIS — R2689 Other abnormalities of gait and mobility: Secondary | ICD-10-CM | POA: Diagnosis not present

## 2021-10-09 DIAGNOSIS — R293 Abnormal posture: Secondary | ICD-10-CM | POA: Diagnosis not present

## 2021-10-09 DIAGNOSIS — M25551 Pain in right hip: Secondary | ICD-10-CM | POA: Diagnosis not present

## 2021-10-12 DIAGNOSIS — R2689 Other abnormalities of gait and mobility: Secondary | ICD-10-CM | POA: Diagnosis not present

## 2021-10-12 DIAGNOSIS — M25551 Pain in right hip: Secondary | ICD-10-CM | POA: Diagnosis not present

## 2021-10-12 DIAGNOSIS — R293 Abnormal posture: Secondary | ICD-10-CM | POA: Diagnosis not present

## 2021-10-12 DIAGNOSIS — M25651 Stiffness of right hip, not elsewhere classified: Secondary | ICD-10-CM | POA: Diagnosis not present

## 2021-10-16 DIAGNOSIS — R2689 Other abnormalities of gait and mobility: Secondary | ICD-10-CM | POA: Diagnosis not present

## 2021-10-16 DIAGNOSIS — M25651 Stiffness of right hip, not elsewhere classified: Secondary | ICD-10-CM | POA: Diagnosis not present

## 2021-10-16 DIAGNOSIS — R293 Abnormal posture: Secondary | ICD-10-CM | POA: Diagnosis not present

## 2021-10-16 DIAGNOSIS — M25551 Pain in right hip: Secondary | ICD-10-CM | POA: Diagnosis not present

## 2021-10-19 DIAGNOSIS — R293 Abnormal posture: Secondary | ICD-10-CM | POA: Diagnosis not present

## 2021-10-19 DIAGNOSIS — M25551 Pain in right hip: Secondary | ICD-10-CM | POA: Diagnosis not present

## 2021-10-19 DIAGNOSIS — M25651 Stiffness of right hip, not elsewhere classified: Secondary | ICD-10-CM | POA: Diagnosis not present

## 2021-10-19 DIAGNOSIS — R2689 Other abnormalities of gait and mobility: Secondary | ICD-10-CM | POA: Diagnosis not present

## 2021-10-23 ENCOUNTER — Encounter (HOSPITAL_BASED_OUTPATIENT_CLINIC_OR_DEPARTMENT_OTHER): Payer: Self-pay

## 2021-10-23 ENCOUNTER — Other Ambulatory Visit: Payer: Self-pay

## 2021-10-23 ENCOUNTER — Emergency Department (HOSPITAL_BASED_OUTPATIENT_CLINIC_OR_DEPARTMENT_OTHER)
Admission: EM | Admit: 2021-10-23 | Discharge: 2021-10-23 | Disposition: A | Discharge status: Home / Self Care | Payer: Medicare Other | Attending: Emergency Medicine | Admitting: Emergency Medicine

## 2021-10-23 ENCOUNTER — Emergency Department (HOSPITAL_BASED_OUTPATIENT_CLINIC_OR_DEPARTMENT_OTHER): Payer: Medicare Other | Admitting: Radiology

## 2021-10-23 ENCOUNTER — Emergency Department (HOSPITAL_BASED_OUTPATIENT_CLINIC_OR_DEPARTMENT_OTHER): Payer: Medicare Other

## 2021-10-23 DIAGNOSIS — S6990XA Unspecified injury of unspecified wrist, hand and finger(s), initial encounter: Secondary | ICD-10-CM | POA: Insufficient documentation

## 2021-10-23 DIAGNOSIS — S20212A Contusion of left front wall of thorax, initial encounter: Secondary | ICD-10-CM | POA: Diagnosis not present

## 2021-10-23 DIAGNOSIS — S0990XA Unspecified injury of head, initial encounter: Secondary | ICD-10-CM | POA: Insufficient documentation

## 2021-10-23 DIAGNOSIS — W01198A Fall on same level from slipping, tripping and stumbling with subsequent striking against other object, initial encounter: Secondary | ICD-10-CM | POA: Diagnosis not present

## 2021-10-23 DIAGNOSIS — R0781 Pleurodynia: Secondary | ICD-10-CM | POA: Diagnosis not present

## 2021-10-23 DIAGNOSIS — M25532 Pain in left wrist: Secondary | ICD-10-CM | POA: Diagnosis not present

## 2021-10-23 DIAGNOSIS — W19XXXA Unspecified fall, initial encounter: Secondary | ICD-10-CM

## 2021-10-23 DIAGNOSIS — S299XXA Unspecified injury of thorax, initial encounter: Secondary | ICD-10-CM | POA: Diagnosis not present

## 2021-10-23 NOTE — ED Provider Notes (Signed)
[place] EMERGENCY DEPT Provider Note   CSN: [id number] Arrival date & time: 10/23/21  1429     History  Chief Complaint  Patient presents with   Lydia Osborne is a 71 y.o. female.  HPI Patient fell in her garage last night.  She reports she turned and tripped over an item on the floor.  She fell on her left wrist.  Patient did strike her head but did not have loss of consciousness.  She also developed a bruise and pain on the left side of the chest wall.  Patient reports that she has not had headache.  She has had no visual change.  No nausea no vomiting.  He does have some pain in the left chest wall if she presses on it and there is a large bruise on her breast but no shortness of breath or difficulty breathing.  Patient went to [facility] first this morning because of her wrist injury.  She was identified to have a wrist fracture and placed in a splint and a sling patient was referred to the emergency department for further evaluation of head and chest injury.    Home Medications Prior to Admission medications   Medication Sig Start Date End Date Taking? Authorizing Provider  levothyroxine (SYNTHROID, LEVOTHROID) 125 MCG tablet Take 125 mcg by mouth daily. 01/02/18   [provider]  LORazepam (ATIVAN) 0.5 MG tablet Take 0.5 mg by mouth daily as needed for anxiety. 01/02/18   [provider]      Allergies    Sulfa antibiotics    Review of Systems   Review of Systems 10 systems reviewed negative except as per HPI Physical Exam Updated Vital Signs BP 114/80   Pulse 70   Temp 98.6 F (37 C) (Oral)   Resp 17   Ht 5\' 7"  (1.702 m)   Wt 91.2 kg   SpO2 93%   BMI 31.48 kg/m  Physical Exam Constitutional:      Comments: Alert nontoxic well in appearance  HENT:     Head: Normocephalic and atraumatic.     Mouth/Throat:     Pharynx: Oropharynx is clear.  Eyes:     Extraocular Movements: Extraocular movements intact.   Cardiovascular:     Rate and Rhythm: Normal rate and regular rhythm.  Pulmonary:     Effort: Pulmonary effort is normal.     Breath sounds: Normal breath sounds.     Comments: Patient does have a large ecchymotic bruise to the left breast to the underside and lateral.  Some tenderness to the left chest wall at about the ninth and 10th ribs in the anterior axillary line.  No crepitus. Abdominal:     General: There is no distension.     Palpations: Abdomen is soft.     Tenderness: There is no abdominal tenderness. There is no guarding.     Comments: No pain to palpation in the abdomen over the upper quadrants.  Musculoskeletal:     Comments: Left forearm is in a splint and sling.  The fingers are warm and dry.  Movement of the fingers.  Other extremities normal range of motion.  Skin:    General: Skin is warm and dry.  Neurological:     General: No focal deficit present.     Mental Status: She is oriented to person, place, and time.     Motor: No weakness.     Coordination: Coordination normal.  Psychiatric:  Mood and Affect: Mood normal.    ED Results / Procedures / Treatments   Labs (all labs ordered are listed, but only abnormal results are displayed) Labs Reviewed - No data to display  EKG None  Radiology DG Ribs Unilateral W/Chest Left  Result Date: 10/23/2021 CLINICAL DATA:  Fall with injury.  Left rib pain. EXAM: LEFT RIBS AND CHEST - 3+ VIEW COMPARISON:  Chest CT 05/27/2020 FINDINGS: The cardiomediastinal silhouette is within normal limits. Minimal atelectasis is present in the lung bases. Small right lower lung nodules are again seen, more fully evaluated on multiple prior CT examinations. No sizable pleural effusion or pneumothorax is identified. No rib fracture is identified. IMPRESSION: No rib fracture identified. Minimal bibasilar atelectasis. Electronically Signed   By: Logan Bores M.D.   On: 10/23/2021 15:26   CT Head Wo Contrast  Result Date:  10/23/2021 CLINICAL DATA:  Head injury after fall. EXAM: CT HEAD WITHOUT CONTRAST TECHNIQUE: Contiguous axial images were obtained from the base of the skull through the vertex without intravenous contrast. RADIATION DOSE REDUCTION: This exam was performed according to the departmental dose-optimization program which includes automated exposure control, adjustment of the mA and/or kV according to patient size and/or use of iterative reconstruction technique. COMPARISON:  None Available. FINDINGS: Brain: No evidence of acute infarction, hemorrhage, hydrocephalus, extra-axial collection or mass lesion/mass effect. Vascular: No hyperdense vessel or unexpected calcification. Skull: Normal. Negative for fracture or focal lesion. Sinuses/Orbits: No acute finding. Other: None. IMPRESSION: No acute intracranial abnormality seen. Electronically Signed   By: Marijo Conception M.D.   On: 10/23/2021 15:28    Procedures Procedures    Medications Ordered in ED Medications - No data to display  ED Course/ Medical Decision Making/ A&P                           Medical Decision Making  Patient fell a little less than 24 hours ago.  Her main concern was a wrist injury.  She was treated at [facility] with splinting for wrist fracture.  No further work-up needed for ED for wrist injury.  She was referred for a fall with head injury and chest wall bruising.  CT scan head obtained and chest x-ray with rib series obtained.  Patient is not anticoagulated.  Radiology review shows normal CT head and no rib fractures identified  Clinically patient is in good condition.  No signs of confusion, somnolence or headache.  Patient is not on anticoagulants.  She has negative CT head.  Low risk of delayed head injury complications.  Stable for discharge discharge instructions include signs and symptoms for return.  Patient does have a large bruise to the left breast.  She has some chest wall tenderness but no crepitus and normal  breath sounds without any subjective difficulty breathing.  Chest x-ray does not show fractures.  At this time stable with chest wall contusion.  Discharge instructions include return precautions.  Physical exam was for nontender abdominal exam.  At this time I have very low suspicion for solid organ injury or intra-abdominal injury.  I do not feel that CT scan of the abdomen is needed.  Patient is comfortable in her splint that was applied at orthopedics office.  She denies need for any additional pain medications.  She advises she will take Tylenol and feels that is sufficient.  I recommend recheck with PCP.        Final Clinical Impression(s) / ED  Diagnoses Final diagnoses:  Fall, initial encounter  Injury of head, initial encounter  Chest wall contusion, left, initial encounter    Rx / DC Orders ED Discharge Orders     None         Charlesetta Shanks, MD 10/23/21 1744

## 2021-10-23 NOTE — ED Triage Notes (Signed)
Pt presents with injuries from a ground level mechanical fall. Pt was evaluated at [facility] and sent to the ED for an evaluation of her head. Pt was dx with a L wrist fx. Pt states she did hit her head but denies LOC. Pt c/o pain to the L ribs.  Denies neck or back pain. Pt in not on anticoagulants.

## 2021-10-27 DIAGNOSIS — M25532 Pain in left wrist: Secondary | ICD-10-CM | POA: Diagnosis not present

## 2021-10-27 DIAGNOSIS — S52502D Unspecified fracture of the lower end of left radius, subsequent encounter for closed fracture with routine healing: Secondary | ICD-10-CM | POA: Diagnosis not present

## 2021-10-29 DIAGNOSIS — M25651 Stiffness of right hip, not elsewhere classified: Secondary | ICD-10-CM | POA: Diagnosis not present

## 2021-10-29 DIAGNOSIS — R2689 Other abnormalities of gait and mobility: Secondary | ICD-10-CM | POA: Diagnosis not present

## 2021-10-29 DIAGNOSIS — R293 Abnormal posture: Secondary | ICD-10-CM | POA: Diagnosis not present

## 2021-10-29 DIAGNOSIS — M25551 Pain in right hip: Secondary | ICD-10-CM | POA: Diagnosis not present

## 2021-11-03 DIAGNOSIS — R2689 Other abnormalities of gait and mobility: Secondary | ICD-10-CM | POA: Diagnosis not present

## 2021-11-03 DIAGNOSIS — R293 Abnormal posture: Secondary | ICD-10-CM | POA: Diagnosis not present

## 2021-11-03 DIAGNOSIS — R0781 Pleurodynia: Secondary | ICD-10-CM | POA: Diagnosis not present

## 2021-11-03 DIAGNOSIS — M25551 Pain in right hip: Secondary | ICD-10-CM | POA: Diagnosis not present

## 2021-11-03 DIAGNOSIS — M25651 Stiffness of right hip, not elsewhere classified: Secondary | ICD-10-CM | POA: Diagnosis not present

## 2021-11-04 DIAGNOSIS — H524 Presbyopia: Secondary | ICD-10-CM | POA: Diagnosis not present

## 2021-11-04 DIAGNOSIS — H2513 Age-related nuclear cataract, bilateral: Secondary | ICD-10-CM | POA: Diagnosis not present

## 2021-11-05 DIAGNOSIS — M25651 Stiffness of right hip, not elsewhere classified: Secondary | ICD-10-CM | POA: Diagnosis not present

## 2021-11-05 DIAGNOSIS — M25551 Pain in right hip: Secondary | ICD-10-CM | POA: Diagnosis not present

## 2021-11-05 DIAGNOSIS — R2689 Other abnormalities of gait and mobility: Secondary | ICD-10-CM | POA: Diagnosis not present

## 2021-11-05 DIAGNOSIS — R293 Abnormal posture: Secondary | ICD-10-CM | POA: Diagnosis not present

## 2021-11-06 DIAGNOSIS — R293 Abnormal posture: Secondary | ICD-10-CM | POA: Diagnosis not present

## 2021-11-06 DIAGNOSIS — M25551 Pain in right hip: Secondary | ICD-10-CM | POA: Diagnosis not present

## 2021-11-06 DIAGNOSIS — R2689 Other abnormalities of gait and mobility: Secondary | ICD-10-CM | POA: Diagnosis not present

## 2021-11-06 DIAGNOSIS — M25651 Stiffness of right hip, not elsewhere classified: Secondary | ICD-10-CM | POA: Diagnosis not present

## 2021-11-11 DIAGNOSIS — R2689 Other abnormalities of gait and mobility: Secondary | ICD-10-CM | POA: Diagnosis not present

## 2021-11-11 DIAGNOSIS — M25551 Pain in right hip: Secondary | ICD-10-CM | POA: Diagnosis not present

## 2021-11-11 DIAGNOSIS — M25651 Stiffness of right hip, not elsewhere classified: Secondary | ICD-10-CM | POA: Diagnosis not present

## 2021-11-11 DIAGNOSIS — R293 Abnormal posture: Secondary | ICD-10-CM | POA: Diagnosis not present

## 2021-11-13 DIAGNOSIS — M25551 Pain in right hip: Secondary | ICD-10-CM | POA: Diagnosis not present

## 2021-11-13 DIAGNOSIS — M25651 Stiffness of right hip, not elsewhere classified: Secondary | ICD-10-CM | POA: Diagnosis not present

## 2021-11-13 DIAGNOSIS — R293 Abnormal posture: Secondary | ICD-10-CM | POA: Diagnosis not present

## 2021-11-13 DIAGNOSIS — R2689 Other abnormalities of gait and mobility: Secondary | ICD-10-CM | POA: Diagnosis not present

## 2021-11-17 DIAGNOSIS — S52502D Unspecified fracture of the lower end of left radius, subsequent encounter for closed fracture with routine healing: Secondary | ICD-10-CM | POA: Diagnosis not present

## 2021-11-18 DIAGNOSIS — R293 Abnormal posture: Secondary | ICD-10-CM | POA: Diagnosis not present

## 2021-11-18 DIAGNOSIS — R2689 Other abnormalities of gait and mobility: Secondary | ICD-10-CM | POA: Diagnosis not present

## 2021-11-18 DIAGNOSIS — M25651 Stiffness of right hip, not elsewhere classified: Secondary | ICD-10-CM | POA: Diagnosis not present

## 2021-11-18 DIAGNOSIS — M25551 Pain in right hip: Secondary | ICD-10-CM | POA: Diagnosis not present

## 2021-11-24 DIAGNOSIS — M25651 Stiffness of right hip, not elsewhere classified: Secondary | ICD-10-CM | POA: Diagnosis not present

## 2021-11-24 DIAGNOSIS — R293 Abnormal posture: Secondary | ICD-10-CM | POA: Diagnosis not present

## 2021-11-24 DIAGNOSIS — M25551 Pain in right hip: Secondary | ICD-10-CM | POA: Diagnosis not present

## 2021-11-24 DIAGNOSIS — R2689 Other abnormalities of gait and mobility: Secondary | ICD-10-CM | POA: Diagnosis not present

## 2021-11-27 DIAGNOSIS — M25551 Pain in right hip: Secondary | ICD-10-CM | POA: Diagnosis not present

## 2021-11-27 DIAGNOSIS — R293 Abnormal posture: Secondary | ICD-10-CM | POA: Diagnosis not present

## 2021-11-27 DIAGNOSIS — R2689 Other abnormalities of gait and mobility: Secondary | ICD-10-CM | POA: Diagnosis not present

## 2021-11-27 DIAGNOSIS — M25651 Stiffness of right hip, not elsewhere classified: Secondary | ICD-10-CM | POA: Diagnosis not present

## 2021-12-03 DIAGNOSIS — M25651 Stiffness of right hip, not elsewhere classified: Secondary | ICD-10-CM | POA: Diagnosis not present

## 2021-12-03 DIAGNOSIS — R293 Abnormal posture: Secondary | ICD-10-CM | POA: Diagnosis not present

## 2021-12-03 DIAGNOSIS — M25551 Pain in right hip: Secondary | ICD-10-CM | POA: Diagnosis not present

## 2021-12-03 DIAGNOSIS — R2689 Other abnormalities of gait and mobility: Secondary | ICD-10-CM | POA: Diagnosis not present

## 2021-12-08 DIAGNOSIS — M25651 Stiffness of right hip, not elsewhere classified: Secondary | ICD-10-CM | POA: Diagnosis not present

## 2021-12-08 DIAGNOSIS — R2689 Other abnormalities of gait and mobility: Secondary | ICD-10-CM | POA: Diagnosis not present

## 2021-12-08 DIAGNOSIS — M25551 Pain in right hip: Secondary | ICD-10-CM | POA: Diagnosis not present

## 2021-12-08 DIAGNOSIS — R293 Abnormal posture: Secondary | ICD-10-CM | POA: Diagnosis not present

## 2021-12-16 DIAGNOSIS — M25651 Stiffness of right hip, not elsewhere classified: Secondary | ICD-10-CM | POA: Diagnosis not present

## 2021-12-16 DIAGNOSIS — R2689 Other abnormalities of gait and mobility: Secondary | ICD-10-CM | POA: Diagnosis not present

## 2021-12-16 DIAGNOSIS — M25551 Pain in right hip: Secondary | ICD-10-CM | POA: Diagnosis not present

## 2021-12-16 DIAGNOSIS — R293 Abnormal posture: Secondary | ICD-10-CM | POA: Diagnosis not present

## 2021-12-30 DIAGNOSIS — R293 Abnormal posture: Secondary | ICD-10-CM | POA: Diagnosis not present

## 2021-12-30 DIAGNOSIS — M25651 Stiffness of right hip, not elsewhere classified: Secondary | ICD-10-CM | POA: Diagnosis not present

## 2021-12-30 DIAGNOSIS — R2689 Other abnormalities of gait and mobility: Secondary | ICD-10-CM | POA: Diagnosis not present

## 2021-12-30 DIAGNOSIS — M25551 Pain in right hip: Secondary | ICD-10-CM | POA: Diagnosis not present

## 2022-01-11 DIAGNOSIS — R2689 Other abnormalities of gait and mobility: Secondary | ICD-10-CM | POA: Diagnosis not present

## 2022-01-11 DIAGNOSIS — M25551 Pain in right hip: Secondary | ICD-10-CM | POA: Diagnosis not present

## 2022-01-11 DIAGNOSIS — M25651 Stiffness of right hip, not elsewhere classified: Secondary | ICD-10-CM | POA: Diagnosis not present

## 2022-01-11 DIAGNOSIS — R293 Abnormal posture: Secondary | ICD-10-CM | POA: Diagnosis not present

## 2022-01-20 DIAGNOSIS — R238 Other skin changes: Secondary | ICD-10-CM | POA: Diagnosis not present

## 2022-01-20 DIAGNOSIS — W57XXXA Bitten or stung by nonvenomous insect and other nonvenomous arthropods, initial encounter: Secondary | ICD-10-CM | POA: Diagnosis not present

## 2022-03-02 DIAGNOSIS — M25651 Stiffness of right hip, not elsewhere classified: Secondary | ICD-10-CM | POA: Diagnosis not present

## 2022-03-02 DIAGNOSIS — R293 Abnormal posture: Secondary | ICD-10-CM | POA: Diagnosis not present

## 2022-03-02 DIAGNOSIS — R2689 Other abnormalities of gait and mobility: Secondary | ICD-10-CM | POA: Diagnosis not present

## 2022-03-02 DIAGNOSIS — M25551 Pain in right hip: Secondary | ICD-10-CM | POA: Diagnosis not present

## 2022-03-11 DIAGNOSIS — R293 Abnormal posture: Secondary | ICD-10-CM | POA: Diagnosis not present

## 2022-03-11 DIAGNOSIS — M25551 Pain in right hip: Secondary | ICD-10-CM | POA: Diagnosis not present

## 2022-03-11 DIAGNOSIS — M25651 Stiffness of right hip, not elsewhere classified: Secondary | ICD-10-CM | POA: Diagnosis not present

## 2022-03-11 DIAGNOSIS — R2689 Other abnormalities of gait and mobility: Secondary | ICD-10-CM | POA: Diagnosis not present

## 2022-03-23 DIAGNOSIS — R2689 Other abnormalities of gait and mobility: Secondary | ICD-10-CM | POA: Diagnosis not present

## 2022-03-23 DIAGNOSIS — M25651 Stiffness of right hip, not elsewhere classified: Secondary | ICD-10-CM | POA: Diagnosis not present

## 2022-03-23 DIAGNOSIS — R293 Abnormal posture: Secondary | ICD-10-CM | POA: Diagnosis not present

## 2022-03-23 DIAGNOSIS — M25551 Pain in right hip: Secondary | ICD-10-CM | POA: Diagnosis not present

## 2022-03-26 DIAGNOSIS — R2689 Other abnormalities of gait and mobility: Secondary | ICD-10-CM | POA: Diagnosis not present

## 2022-03-26 DIAGNOSIS — M25551 Pain in right hip: Secondary | ICD-10-CM | POA: Diagnosis not present

## 2022-03-26 DIAGNOSIS — M25651 Stiffness of right hip, not elsewhere classified: Secondary | ICD-10-CM | POA: Diagnosis not present

## 2022-03-26 DIAGNOSIS — R293 Abnormal posture: Secondary | ICD-10-CM | POA: Diagnosis not present

## 2022-03-30 DIAGNOSIS — M25551 Pain in right hip: Secondary | ICD-10-CM | POA: Diagnosis not present

## 2022-03-30 DIAGNOSIS — M25651 Stiffness of right hip, not elsewhere classified: Secondary | ICD-10-CM | POA: Diagnosis not present

## 2022-03-30 DIAGNOSIS — R2689 Other abnormalities of gait and mobility: Secondary | ICD-10-CM | POA: Diagnosis not present

## 2022-03-30 DIAGNOSIS — R293 Abnormal posture: Secondary | ICD-10-CM | POA: Diagnosis not present

## 2022-04-01 DIAGNOSIS — R293 Abnormal posture: Secondary | ICD-10-CM | POA: Diagnosis not present

## 2022-04-01 DIAGNOSIS — M25551 Pain in right hip: Secondary | ICD-10-CM | POA: Diagnosis not present

## 2022-04-01 DIAGNOSIS — M25651 Stiffness of right hip, not elsewhere classified: Secondary | ICD-10-CM | POA: Diagnosis not present

## 2022-04-01 DIAGNOSIS — R2689 Other abnormalities of gait and mobility: Secondary | ICD-10-CM | POA: Diagnosis not present

## 2022-04-09 DIAGNOSIS — R293 Abnormal posture: Secondary | ICD-10-CM | POA: Diagnosis not present

## 2022-04-09 DIAGNOSIS — M25551 Pain in right hip: Secondary | ICD-10-CM | POA: Diagnosis not present

## 2022-04-09 DIAGNOSIS — R2689 Other abnormalities of gait and mobility: Secondary | ICD-10-CM | POA: Diagnosis not present

## 2022-04-09 DIAGNOSIS — M25651 Stiffness of right hip, not elsewhere classified: Secondary | ICD-10-CM | POA: Diagnosis not present

## 2022-04-12 DIAGNOSIS — M25651 Stiffness of right hip, not elsewhere classified: Secondary | ICD-10-CM | POA: Diagnosis not present

## 2022-04-12 DIAGNOSIS — M25551 Pain in right hip: Secondary | ICD-10-CM | POA: Diagnosis not present

## 2022-04-12 DIAGNOSIS — R2689 Other abnormalities of gait and mobility: Secondary | ICD-10-CM | POA: Diagnosis not present

## 2022-04-12 DIAGNOSIS — R293 Abnormal posture: Secondary | ICD-10-CM | POA: Diagnosis not present

## 2022-04-27 ENCOUNTER — Other Ambulatory Visit: Payer: Self-pay | Admitting: Family Medicine

## 2022-04-27 DIAGNOSIS — M25651 Stiffness of right hip, not elsewhere classified: Secondary | ICD-10-CM | POA: Diagnosis not present

## 2022-04-27 DIAGNOSIS — R2689 Other abnormalities of gait and mobility: Secondary | ICD-10-CM | POA: Diagnosis not present

## 2022-04-27 DIAGNOSIS — Z79899 Other long term (current) drug therapy: Secondary | ICD-10-CM | POA: Diagnosis not present

## 2022-04-27 DIAGNOSIS — D72819 Decreased white blood cell count, unspecified: Secondary | ICD-10-CM | POA: Diagnosis not present

## 2022-04-27 DIAGNOSIS — E7800 Pure hypercholesterolemia, unspecified: Secondary | ICD-10-CM

## 2022-04-27 DIAGNOSIS — M25551 Pain in right hip: Secondary | ICD-10-CM | POA: Diagnosis not present

## 2022-04-27 DIAGNOSIS — Z0001 Encounter for general adult medical examination with abnormal findings: Secondary | ICD-10-CM | POA: Diagnosis not present

## 2022-04-27 DIAGNOSIS — E2839 Other primary ovarian failure: Secondary | ICD-10-CM

## 2022-04-27 DIAGNOSIS — R293 Abnormal posture: Secondary | ICD-10-CM | POA: Diagnosis not present

## 2022-04-29 DIAGNOSIS — R293 Abnormal posture: Secondary | ICD-10-CM | POA: Diagnosis not present

## 2022-04-29 DIAGNOSIS — R7301 Impaired fasting glucose: Secondary | ICD-10-CM | POA: Diagnosis not present

## 2022-04-29 DIAGNOSIS — R2689 Other abnormalities of gait and mobility: Secondary | ICD-10-CM | POA: Diagnosis not present

## 2022-04-29 DIAGNOSIS — E039 Hypothyroidism, unspecified: Secondary | ICD-10-CM | POA: Diagnosis not present

## 2022-04-29 DIAGNOSIS — M25551 Pain in right hip: Secondary | ICD-10-CM | POA: Diagnosis not present

## 2022-04-29 DIAGNOSIS — M25651 Stiffness of right hip, not elsewhere classified: Secondary | ICD-10-CM | POA: Diagnosis not present

## 2022-04-29 DIAGNOSIS — Z79899 Other long term (current) drug therapy: Secondary | ICD-10-CM | POA: Diagnosis not present

## 2022-04-29 DIAGNOSIS — E7800 Pure hypercholesterolemia, unspecified: Secondary | ICD-10-CM | POA: Diagnosis not present

## 2022-05-05 ENCOUNTER — Ambulatory Visit
Admission: RE | Admit: 2022-05-05 | Discharge: 2022-05-05 | Disposition: A | Discharge status: Home / Self Care | Payer: No Typology Code available for payment source | Source: Ambulatory Visit | Attending: Family Medicine | Admitting: Family Medicine

## 2022-05-05 DIAGNOSIS — E7800 Pure hypercholesterolemia, unspecified: Secondary | ICD-10-CM

## 2022-05-06 DIAGNOSIS — M25651 Stiffness of right hip, not elsewhere classified: Secondary | ICD-10-CM | POA: Diagnosis not present

## 2022-05-06 DIAGNOSIS — R2689 Other abnormalities of gait and mobility: Secondary | ICD-10-CM | POA: Diagnosis not present

## 2022-05-06 DIAGNOSIS — M25551 Pain in right hip: Secondary | ICD-10-CM | POA: Diagnosis not present

## 2022-05-06 DIAGNOSIS — R293 Abnormal posture: Secondary | ICD-10-CM | POA: Diagnosis not present

## 2022-05-12 DIAGNOSIS — M25551 Pain in right hip: Secondary | ICD-10-CM | POA: Diagnosis not present

## 2022-05-12 DIAGNOSIS — M71342 Other bursal cyst, left hand: Secondary | ICD-10-CM | POA: Diagnosis not present

## 2022-05-12 DIAGNOSIS — R2689 Other abnormalities of gait and mobility: Secondary | ICD-10-CM | POA: Diagnosis not present

## 2022-05-12 DIAGNOSIS — R293 Abnormal posture: Secondary | ICD-10-CM | POA: Diagnosis not present

## 2022-05-12 DIAGNOSIS — M25651 Stiffness of right hip, not elsewhere classified: Secondary | ICD-10-CM | POA: Diagnosis not present

## 2022-05-13 ENCOUNTER — Other Ambulatory Visit (HOSPITAL_BASED_OUTPATIENT_CLINIC_OR_DEPARTMENT_OTHER): Payer: Self-pay | Admitting: Family Medicine

## 2022-05-13 DIAGNOSIS — Z1231 Encounter for screening mammogram for malignant neoplasm of breast: Secondary | ICD-10-CM

## 2022-05-18 ENCOUNTER — Ambulatory Visit (HOSPITAL_BASED_OUTPATIENT_CLINIC_OR_DEPARTMENT_OTHER)
Admission: RE | Admit: 2022-05-18 | Discharge: 2022-05-18 | Disposition: A | Discharge status: Home / Self Care | Payer: Medicare Other | Source: Ambulatory Visit | Attending: Family Medicine | Admitting: Family Medicine

## 2022-05-18 DIAGNOSIS — Z1231 Encounter for screening mammogram for malignant neoplasm of breast: Secondary | ICD-10-CM | POA: Diagnosis not present

## 2022-05-20 DIAGNOSIS — R2689 Other abnormalities of gait and mobility: Secondary | ICD-10-CM | POA: Diagnosis not present

## 2022-05-20 DIAGNOSIS — M25551 Pain in right hip: Secondary | ICD-10-CM | POA: Diagnosis not present

## 2022-05-20 DIAGNOSIS — R293 Abnormal posture: Secondary | ICD-10-CM | POA: Diagnosis not present

## 2022-05-20 DIAGNOSIS — M25651 Stiffness of right hip, not elsewhere classified: Secondary | ICD-10-CM | POA: Diagnosis not present

## 2022-05-26 DIAGNOSIS — R051 Acute cough: Secondary | ICD-10-CM | POA: Diagnosis not present

## 2022-06-04 DIAGNOSIS — M25551 Pain in right hip: Secondary | ICD-10-CM | POA: Diagnosis not present

## 2022-06-04 DIAGNOSIS — R2689 Other abnormalities of gait and mobility: Secondary | ICD-10-CM | POA: Diagnosis not present

## 2022-06-04 DIAGNOSIS — M25651 Stiffness of right hip, not elsewhere classified: Secondary | ICD-10-CM | POA: Diagnosis not present

## 2022-06-04 DIAGNOSIS — R293 Abnormal posture: Secondary | ICD-10-CM | POA: Diagnosis not present

## 2022-06-17 DIAGNOSIS — R293 Abnormal posture: Secondary | ICD-10-CM | POA: Diagnosis not present

## 2022-06-17 DIAGNOSIS — M25651 Stiffness of right hip, not elsewhere classified: Secondary | ICD-10-CM | POA: Diagnosis not present

## 2022-06-17 DIAGNOSIS — M25551 Pain in right hip: Secondary | ICD-10-CM | POA: Diagnosis not present

## 2022-06-17 DIAGNOSIS — R2689 Other abnormalities of gait and mobility: Secondary | ICD-10-CM | POA: Diagnosis not present

## 2022-06-24 DIAGNOSIS — M25651 Stiffness of right hip, not elsewhere classified: Secondary | ICD-10-CM | POA: Diagnosis not present

## 2022-06-24 DIAGNOSIS — M25551 Pain in right hip: Secondary | ICD-10-CM | POA: Diagnosis not present

## 2022-06-24 DIAGNOSIS — R293 Abnormal posture: Secondary | ICD-10-CM | POA: Diagnosis not present

## 2022-06-24 DIAGNOSIS — R2689 Other abnormalities of gait and mobility: Secondary | ICD-10-CM | POA: Diagnosis not present

## 2022-07-01 DIAGNOSIS — M25651 Stiffness of right hip, not elsewhere classified: Secondary | ICD-10-CM | POA: Diagnosis not present

## 2022-07-01 DIAGNOSIS — R2689 Other abnormalities of gait and mobility: Secondary | ICD-10-CM | POA: Diagnosis not present

## 2022-07-01 DIAGNOSIS — M25551 Pain in right hip: Secondary | ICD-10-CM | POA: Diagnosis not present

## 2022-07-01 DIAGNOSIS — R293 Abnormal posture: Secondary | ICD-10-CM | POA: Diagnosis not present

## 2022-08-02 DIAGNOSIS — H524 Presbyopia: Secondary | ICD-10-CM | POA: Diagnosis not present

## 2022-08-26 DIAGNOSIS — L814 Other melanin hyperpigmentation: Secondary | ICD-10-CM | POA: Diagnosis not present

## 2022-08-26 DIAGNOSIS — M71342 Other bursal cyst, left hand: Secondary | ICD-10-CM | POA: Diagnosis not present

## 2022-08-26 DIAGNOSIS — L821 Other seborrheic keratosis: Secondary | ICD-10-CM | POA: Diagnosis not present

## 2022-08-26 DIAGNOSIS — D225 Melanocytic nevi of trunk: Secondary | ICD-10-CM | POA: Diagnosis not present

## 2022-09-09 DIAGNOSIS — M7611 Psoas tendinitis, right hip: Secondary | ICD-10-CM | POA: Diagnosis not present

## 2022-09-09 DIAGNOSIS — R293 Abnormal posture: Secondary | ICD-10-CM | POA: Diagnosis not present

## 2022-09-09 DIAGNOSIS — R29898 Other symptoms and signs involving the musculoskeletal system: Secondary | ICD-10-CM | POA: Diagnosis not present

## 2022-09-16 DIAGNOSIS — R293 Abnormal posture: Secondary | ICD-10-CM | POA: Diagnosis not present

## 2022-09-16 DIAGNOSIS — R29898 Other symptoms and signs involving the musculoskeletal system: Secondary | ICD-10-CM | POA: Diagnosis not present

## 2022-09-16 DIAGNOSIS — M7611 Psoas tendinitis, right hip: Secondary | ICD-10-CM | POA: Diagnosis not present

## 2022-09-23 DIAGNOSIS — R29898 Other symptoms and signs involving the musculoskeletal system: Secondary | ICD-10-CM | POA: Diagnosis not present

## 2022-09-23 DIAGNOSIS — R293 Abnormal posture: Secondary | ICD-10-CM | POA: Diagnosis not present

## 2022-09-23 DIAGNOSIS — M7611 Psoas tendinitis, right hip: Secondary | ICD-10-CM | POA: Diagnosis not present

## 2022-09-30 DIAGNOSIS — R293 Abnormal posture: Secondary | ICD-10-CM | POA: Diagnosis not present

## 2022-09-30 DIAGNOSIS — R29898 Other symptoms and signs involving the musculoskeletal system: Secondary | ICD-10-CM | POA: Diagnosis not present

## 2022-09-30 DIAGNOSIS — M7611 Psoas tendinitis, right hip: Secondary | ICD-10-CM | POA: Diagnosis not present

## 2022-10-04 DIAGNOSIS — R293 Abnormal posture: Secondary | ICD-10-CM | POA: Diagnosis not present

## 2022-10-04 DIAGNOSIS — R29898 Other symptoms and signs involving the musculoskeletal system: Secondary | ICD-10-CM | POA: Diagnosis not present

## 2022-10-04 DIAGNOSIS — M7611 Psoas tendinitis, right hip: Secondary | ICD-10-CM | POA: Diagnosis not present

## 2022-10-13 DIAGNOSIS — R29898 Other symptoms and signs involving the musculoskeletal system: Secondary | ICD-10-CM | POA: Diagnosis not present

## 2022-10-13 DIAGNOSIS — R293 Abnormal posture: Secondary | ICD-10-CM | POA: Diagnosis not present

## 2022-10-13 DIAGNOSIS — M7611 Psoas tendinitis, right hip: Secondary | ICD-10-CM | POA: Diagnosis not present

## 2022-10-14 ENCOUNTER — Ambulatory Visit
Admission: RE | Admit: 2022-10-14 | Discharge: 2022-10-14 | Disposition: A | Discharge status: Home / Self Care | Payer: Medicare Other | Source: Ambulatory Visit | Attending: Family Medicine | Admitting: Family Medicine

## 2022-10-14 DIAGNOSIS — N958 Other specified menopausal and perimenopausal disorders: Secondary | ICD-10-CM | POA: Diagnosis not present

## 2022-10-14 DIAGNOSIS — E2839 Other primary ovarian failure: Secondary | ICD-10-CM

## 2022-10-20 DIAGNOSIS — Z09 Encounter for follow-up examination after completed treatment for conditions other than malignant neoplasm: Secondary | ICD-10-CM | POA: Diagnosis not present

## 2022-10-20 DIAGNOSIS — K5730 Diverticulosis of large intestine without perforation or abscess without bleeding: Secondary | ICD-10-CM | POA: Diagnosis not present

## 2022-10-20 DIAGNOSIS — K635 Polyp of colon: Secondary | ICD-10-CM | POA: Diagnosis not present

## 2022-10-20 DIAGNOSIS — Z86010 Personal history of colonic polyps: Secondary | ICD-10-CM | POA: Diagnosis not present

## 2022-10-20 DIAGNOSIS — K648 Other hemorrhoids: Secondary | ICD-10-CM | POA: Diagnosis not present

## 2022-10-22 DIAGNOSIS — K635 Polyp of colon: Secondary | ICD-10-CM | POA: Diagnosis not present

## 2022-11-03 DIAGNOSIS — E039 Hypothyroidism, unspecified: Secondary | ICD-10-CM | POA: Diagnosis not present

## 2022-11-03 DIAGNOSIS — Z79899 Other long term (current) drug therapy: Secondary | ICD-10-CM | POA: Diagnosis not present

## 2022-11-03 DIAGNOSIS — F419 Anxiety disorder, unspecified: Secondary | ICD-10-CM | POA: Diagnosis not present

## 2022-11-03 DIAGNOSIS — R7301 Impaired fasting glucose: Secondary | ICD-10-CM | POA: Diagnosis not present

## 2022-11-15 ENCOUNTER — Encounter (HOSPITAL_BASED_OUTPATIENT_CLINIC_OR_DEPARTMENT_OTHER): Payer: Self-pay | Admitting: Emergency Medicine

## 2022-11-15 ENCOUNTER — Inpatient Hospital Stay (HOSPITAL_BASED_OUTPATIENT_CLINIC_OR_DEPARTMENT_OTHER)
Admission: EM | Admit: 2022-11-15 | Discharge: 2022-11-18 | DRG: 392 | Disposition: A | Discharge status: Home / Self Care | Payer: Medicare Other | Attending: Internal Medicine | Admitting: Internal Medicine

## 2022-11-15 ENCOUNTER — Emergency Department (HOSPITAL_BASED_OUTPATIENT_CLINIC_OR_DEPARTMENT_OTHER): Payer: Medicare Other

## 2022-11-15 ENCOUNTER — Other Ambulatory Visit: Payer: Self-pay

## 2022-11-15 DIAGNOSIS — E785 Hyperlipidemia, unspecified: Secondary | ICD-10-CM | POA: Diagnosis not present

## 2022-11-15 DIAGNOSIS — Z882 Allergy status to sulfonamides status: Secondary | ICD-10-CM | POA: Diagnosis not present

## 2022-11-15 DIAGNOSIS — N179 Acute kidney failure, unspecified: Secondary | ICD-10-CM | POA: Diagnosis not present

## 2022-11-15 DIAGNOSIS — E669 Obesity, unspecified: Secondary | ICD-10-CM | POA: Diagnosis not present

## 2022-11-15 DIAGNOSIS — Z803 Family history of malignant neoplasm of breast: Secondary | ICD-10-CM | POA: Diagnosis not present

## 2022-11-15 DIAGNOSIS — K5730 Diverticulosis of large intestine without perforation or abscess without bleeding: Secondary | ICD-10-CM | POA: Diagnosis not present

## 2022-11-15 DIAGNOSIS — Z8249 Family history of ischemic heart disease and other diseases of the circulatory system: Secondary | ICD-10-CM

## 2022-11-15 DIAGNOSIS — F419 Anxiety disorder, unspecified: Secondary | ICD-10-CM | POA: Diagnosis not present

## 2022-11-15 DIAGNOSIS — K5900 Constipation, unspecified: Secondary | ICD-10-CM | POA: Diagnosis not present

## 2022-11-15 DIAGNOSIS — Z90710 Acquired absence of both cervix and uterus: Secondary | ICD-10-CM

## 2022-11-15 DIAGNOSIS — Z79890 Hormone replacement therapy (postmenopausal): Secondary | ICD-10-CM | POA: Diagnosis not present

## 2022-11-15 DIAGNOSIS — K5720 Diverticulitis of large intestine with perforation and abscess without bleeding: Secondary | ICD-10-CM | POA: Diagnosis not present

## 2022-11-15 DIAGNOSIS — K5792 Diverticulitis of intestine, part unspecified, without perforation or abscess without bleeding: Secondary | ICD-10-CM | POA: Diagnosis not present

## 2022-11-15 DIAGNOSIS — Z808 Family history of malignant neoplasm of other organs or systems: Secondary | ICD-10-CM

## 2022-11-15 DIAGNOSIS — E039 Hypothyroidism, unspecified: Secondary | ICD-10-CM | POA: Diagnosis not present

## 2022-11-15 DIAGNOSIS — Z6833 Body mass index (BMI) 33.0-33.9, adult: Secondary | ICD-10-CM | POA: Diagnosis not present

## 2022-11-15 DIAGNOSIS — Z8349 Family history of other endocrine, nutritional and metabolic diseases: Secondary | ICD-10-CM | POA: Diagnosis not present

## 2022-11-15 LAB — URINALYSIS, ROUTINE W REFLEX MICROSCOPIC
Bacteria, UA: NONE SEEN
Bilirubin Urine: NEGATIVE
Glucose, UA: NEGATIVE mg/dL
Hgb urine dipstick: NEGATIVE
Ketones, ur: NEGATIVE mg/dL
Nitrite: NEGATIVE
Specific Gravity, Urine: 1.029 (ref 1.005–1.030)
pH: 5.5 (ref 5.0–8.0)

## 2022-11-15 LAB — COMPREHENSIVE METABOLIC PANEL
ALT: 24 U/L (ref 0–44)
AST: 18 U/L (ref 15–41)
Albumin: 4 g/dL (ref 3.5–5.0)
Alkaline Phosphatase: 96 U/L (ref 38–126)
Anion gap: 9 (ref 5–15)
BUN: 13 mg/dL (ref 8–23)
CO2: 27 mmol/L (ref 22–32)
Calcium: 9.6 mg/dL (ref 8.9–10.3)
Chloride: 100 mmol/L (ref 98–111)
Creatinine, Ser: 0.92 mg/dL (ref 0.44–1.00)
GFR, Estimated: >60 mL/min (ref >60)
Glucose, Bld: 96 mg/dL (ref 70–99)
Potassium: 4.1 mmol/L (ref 3.5–5.1)
Sodium: 136 mmol/L (ref 135–145)
Total Bilirubin: 0.5 mg/dL (ref 0.3–1.2)
Total Protein: 7.3 g/dL (ref 6.5–8.1)

## 2022-11-15 LAB — CBC
HCT: 39.1 % (ref 36.0–46.0)
Hemoglobin: 13.1 g/dL (ref 12.0–15.0)
MCH: 30 pg (ref 26.0–34.0)
MCHC: 33.5 g/dL (ref 30.0–36.0)
MCV: 89.7 fL (ref 80.0–100.0)
Platelets: 307 10*3/uL (ref 150–400)
RBC: 4.36 MIL/uL (ref 3.87–5.11)
RDW: 11.6 % (ref 11.5–15.5)
WBC: 7.5 10*3/uL (ref 4.0–10.5)
nRBC: 0 % (ref 0.0–0.2)

## 2022-11-15 LAB — LIPASE, BLOOD: Lipase: 15 U/L (ref 11–51)

## 2022-11-15 MED ORDER — OXYCODONE HCL 5 MG PO TABS: 5.0000 mg | ORAL_TABLET | ORAL | Status: DC | PRN

## 2022-11-15 MED ORDER — IOHEXOL 300 MG/ML  SOLN
100.0000 mL | Freq: Once | INTRAMUSCULAR | Status: AC | PRN
  Administered 2022-11-15: 85 mL via INTRAVENOUS

## 2022-11-15 MED ORDER — ONDANSETRON HCL 4 MG PO TABS: 4.0000 mg | ORAL_TABLET | Freq: Four times a day (QID) | ORAL | Status: DC | PRN

## 2022-11-15 MED ORDER — LEVOTHYROXINE SODIUM 125 MCG PO TABS
125.0000 ug | ORAL_TABLET | Freq: Every day | ORAL | Status: DC
  Administered 2022-11-16 – 2022-11-18 (×3): 125 ug via ORAL
  Filled 2022-11-15 (×3): qty 1

## 2022-11-15 MED ORDER — SODIUM CHLORIDE 0.9 % IV SOLN: INTRAVENOUS | Status: AC

## 2022-11-15 MED ORDER — ACETAMINOPHEN 650 MG RE SUPP: 650.0000 mg | Freq: Four times a day (QID) | RECTAL | Status: DC | PRN

## 2022-11-15 MED ORDER — LORAZEPAM 0.5 MG PO TABS: 0.5000 mg | ORAL_TABLET | Freq: Every day | ORAL | Status: DC | PRN

## 2022-11-15 MED ORDER — PIPERACILLIN-TAZOBACTAM 3.375 G IVPB
3.3750 g | Freq: Three times a day (TID) | INTRAVENOUS | Status: DC
  Administered 2022-11-15 – 2022-11-18 (×7): 3.375 g via INTRAVENOUS
  Filled 2022-11-15 (×7): qty 50

## 2022-11-15 MED ORDER — PIPERACILLIN-TAZOBACTAM 3.375 G IVPB
3.3750 g | Freq: Once | INTRAVENOUS | Status: AC
  Administered 2022-11-15: 3.375 g via INTRAVENOUS
  Filled 2022-11-15: qty 50

## 2022-11-15 MED ORDER — HYDROMORPHONE HCL 1 MG/ML IJ SOLN: 0.5000 mg | INTRAMUSCULAR | Status: DC | PRN

## 2022-11-15 MED ORDER — ACETAMINOPHEN 325 MG PO TABS: 650.0000 mg | ORAL_TABLET | Freq: Four times a day (QID) | ORAL | Status: DC | PRN

## 2022-11-15 MED ORDER — ONDANSETRON HCL 4 MG/2ML IJ SOLN: 4.0000 mg | Freq: Four times a day (QID) | INTRAMUSCULAR | Status: DC | PRN

## 2022-11-15 NOTE — ED Notes (Signed)
Paged Dr. Toth to PA Ge[place] ?

## 2022-11-15 NOTE — ED Provider Notes (Addendum)
[place] EMERGENCY DEPARTMENT AT [facility] Provider Note   CSN: [id number] Arrival date & time: 11/15/22  1220     History  Chief Complaint  Patient presents with   Abdominal Pain    Lydia Osborne is a 72 y.o. female.  Patient with history of hysterectomy presents to the emergency department for approximately 1 week of lower abdominal pain and constipation.  Pain is dull and aching.  She has only passed small pieces of stool.  No blood.  Denies urinary symptoms.  No fever, nausea, vomiting.  No chest pain or shortness of breath.  No upper abdominal pain.  She reports remote history of diverticulitis.  She states that she recently had a colonoscopy and was told that she had diverticulosis.  She is uncertain if her current symptoms feel like previous episode of diverticulitis.       Home Medications Prior to Admission medications   Medication Sig Start Date End Date Taking? Authorizing Provider  levothyroxine (SYNTHROID, LEVOTHROID) 125 MCG tablet Take 125 mcg by mouth daily. 01/02/18   [provider]  LORazepam (ATIVAN) 0.5 MG tablet Take 0.5 mg by mouth daily as needed for anxiety. 01/02/18   [provider]      Allergies    Sulfa antibiotics    Review of Systems   Review of Systems  Physical Exam Updated Vital Signs BP 124/68 (BP Location: Right Arm)   Pulse 87   Temp 98 F (36.7 C)   Resp 16   Ht 5\' 7"  (1.702 m)   Wt 97.5 kg   SpO2 99%   BMI 33.67 kg/m  Physical Exam Vitals and nursing note reviewed.  Constitutional:      General: She is not in acute distress.    Appearance: She is well-developed.  HENT:     Head: Normocephalic and atraumatic.     Right Ear: External ear normal.     Left Ear: External ear normal.     Nose: Nose normal.  Eyes:     Conjunctiva/sclera: Conjunctivae normal.  Cardiovascular:     Rate and Rhythm: Normal rate and regular rhythm.     Heart sounds: No murmur heard. Pulmonary:     Effort:  No respiratory distress.     Breath sounds: No wheezing, rhonchi or rales.  Abdominal:     Palpations: Abdomen is soft.     Tenderness: There is abdominal tenderness in the suprapubic area and left lower quadrant. There is no guarding or rebound. Negative signs include Murphy's sign and McBurney's sign.  Musculoskeletal:     Cervical back: Normal range of motion and neck supple.     Right lower leg: No edema.     Left lower leg: No edema.  Skin:    General: Skin is warm and dry.     Findings: No rash.  Neurological:     General: No focal deficit present.     Mental Status: She is alert. Mental status is at baseline.     Motor: No weakness.  Psychiatric:        Mood and Affect: Mood normal.     ED Results / Procedures / Treatments   Labs (all labs ordered are listed, but only abnormal results are displayed) Labs Reviewed  URINALYSIS, ROUTINE W REFLEX MICROSCOPIC - Abnormal; Notable for the following components:      Result Value   Protein, ur TRACE (*)    Leukocytes,Ua MODERATE (*)    All other components within normal  limits  LIPASE, BLOOD  COMPREHENSIVE METABOLIC PANEL  CBC    EKG None  Radiology CT ABDOMEN PELVIS W CONTRAST  Result Date: 11/15/2022 CLINICAL DATA:  Left lower quadrant pain EXAM: CT ABDOMEN AND PELVIS WITH CONTRAST TECHNIQUE: Multidetector CT imaging of the abdomen and pelvis was performed using the standard protocol following bolus administration of intravenous contrast. RADIATION DOSE REDUCTION: This exam was performed according to the departmental dose-optimization program which includes automated exposure control, adjustment of the mA and/or kV according to patient size and/or use of iterative reconstruction technique. CONTRAST:  85mL OMNIPAQUE IOHEXOL 300 MG/ML  SOLN COMPARISON:  Chest CT 05/27/2020 FINDINGS: Lower chest: No acute airspace disease or pleural effusion. Multiple right lung base pulmonary nodules, measuring up to 6 mm, no change and  consistent with benign finding, no imaging follow-up is recommended. Hepatobiliary: No focal liver abnormality is seen. No gallstones, gallbladder wall thickening, or biliary dilatation. Pancreas: Unremarkable. No pancreatic ductal dilatation or surrounding inflammatory changes. Spleen: Normal in size without focal abnormality. Adrenals/Urinary Tract: Adrenal glands are unremarkable. Kidneys are normal, without renal calculi, focal lesion, or hydronephrosis. Bladder is unremarkable. Stomach/Bowel: Stomach is within normal limits. Appendix appears normal. Diverticular disease of the colon. Focal wall thickening and inflammation involving the sigmoid colon. No free air. Lower density collection along the left aspect of the inflamed sigmoid colon measuring 2.4 by 1.4 by 2 cm, series 2 image 62, potentially representing developing intramural abscess. Vascular/Lymphatic: Nonaneurysmal aorta. No suspicious lymph nodes. Mild atherosclerosis Reproductive: Status post hysterectomy. No adnexal masses. Other: Negative for pelvic effusion or free air. Musculoskeletal: Bilateral hip replacements with artifact. No acute osseous abnormality. Degenerative changes of the spine with grade 1 anterolisthesis L2 on L3 IMPRESSION: 1. Findings consistent with acute sigmoid colon diverticulitis. 2.4 cm low-density collection along the left wall of the inflamed sigmoid colon, potentially representing developing intramural abscess. No free air. Electronically Signed   By: Jasmine Pang M.D.   On: 11/15/2022 15:31    Procedures Procedures    Medications Ordered in ED Medications - No data to display  ED Course/ Medical Decision Making/ A&P    Patient seen and examined. History obtained directly from patient. Work-up including labs, imaging, EKG ordered in triage, if performed, were reviewed.    Labs/EKG: Independently reviewed and interpreted.  This included: CBC unremarkable; CMP unremarkable; lipase normal.  UA  pending.  Imaging: I have ordered CT abdomen pelvis to evaluate for the possibility of diverticulitis as patient is mild to moderately tender in the left lower quadrant.  Medications/Fluids: None ordered  Most recent vital signs reviewed and are as follows: BP 124/68 (BP Location: Right Arm)   Pulse 87   Temp 98 F (36.7 C)   Resp 16   Ht 5\' 7"  (1.702 m)   Wt 97.5 kg   SpO2 99%   BMI 33.67 kg/m   Initial impression: Lower abdominal pain, worse on the left side for the past 1 week.  5:35 PM I spoke with Dr. Alvino Chapel of hospitalist service who accepts patient for admission.  Request that I request surgical consult tomorrow morning.  6:13 PM I did speak with Dr. Carolynne Edouard and requested surgical consult in the AM.                              Medical Decision Making Amount and/or Complexity of Data Reviewed Labs: ordered. Radiology: ordered.  Risk Prescription drug management. Decision regarding hospitalization.  For this patient's complaint of abdominal pain, the following conditions were considered on the differential diagnosis: gastritis/PUD, enteritis/duodenitis, appendicitis, cholelithiasis/cholecystitis, cholangitis, pancreatitis, ruptured viscus, colitis, diverticulitis, small/large bowel obstruction, proctitis, cystitis, pyelonephritis, ureteral colic, aortic dissection, aortic aneurysm. In women, ectopic pregnancy, pelvic inflammatory disease, ovarian cysts, and tubo-ovarian abscess were also considered. Atypical chest etiologies were also considered including ACS, PE, and pneumonia.     Final Clinical Impression(s) / ED Diagnoses Final diagnoses:  Diverticulitis  Colonic diverticular abscess    Rx / DC Orders ED Discharge Orders     None         Renne Crigler, PA-C 11/15/22 1736    Renne Crigler, PA-C 11/15/22 1813    Long, Arlyss Repress, MD 11/19/22 [phone]

## 2022-11-15 NOTE — H&P (Signed)
History and Physical    Lydia Osborne ZOX:[id number] DOB: 02-Mar-1951 DOA: 11/15/2022  PCP: Darrow Bussing, MD   Patient coming from: Home    Chief Complaint: Abdominal pain  HPI: Lydia Osborne is a pleasant 72 y.o. female with medical history significant for hypothyroidism, anxiety, hyperlipidemia, and diverticulosis who presents to the emergency department with 1 week of lower abdominal pain.  Abdominal pain began approximately 1 wk ago. Pain has been primarily in the lower abdomen. She denies melena, hematochezia, vomiting, fever, or chills. She has only passed small stools during this course and took an enema yesterday.    [facility] ED Course: Upon arrival to the ED, patient is found to be afebrile and saturating well on room air with normal heart rate and stable blood pressure.  CMP and CBC are normal.  CT findings are consistent with acute sigmoid diverticulitis with 2.4 cm intramural collection.  Surgery (Dr. Carolynne Edouard) was consulted by the ED PA, patient was started on Zosyn, and she was transferred to [facility] for admission.  Review of Systems:  All other systems reviewed and apart from HPI, are negative.  Past Medical History:  Diagnosis Date   Anxiety 11/15/2022   Graves' disease in remission    Hyperlipidemia     Past Surgical History:  Procedure Laterality Date   BREAST BIOPSY Right    BREAST EXCISIONAL BIOPSY Left    x2   HIP SURGERY Bilateral    KNEE SURGERY Bilateral    SHOULDER SURGERY Right     Social History:   reports that she has never smoked. She has never used smokeless tobacco. She reports current alcohol use of about 1.0 standard drink of alcohol per week. She reports that she does not use drugs.  Allergies  Allergen Reactions   Sulfa Antibiotics     Extended exposure causes itchiness    Family History  Problem Relation Age of Onset   Breast cancer Mother    Thyroid disease Mother    Heart disease Father     Throat cancer Father      Prior to Admission medications   Medication Sig Start Date End Date Taking? Authorizing Provider  levothyroxine (SYNTHROID, LEVOTHROID) 125 MCG tablet Take 125 mcg by mouth daily. 01/02/18   [provider]  LORazepam (ATIVAN) 0.5 MG tablet Take 0.5 mg by mouth daily as needed for anxiety. 01/02/18   [provider]    Physical Exam: Vitals:   11/15/22 1430 11/15/22 1615 11/15/22 1630 11/15/22 1852  BP: 121/69 122/71 121/75 130/74  Pulse: 77 83 77 74  Resp:   19 18  Temp:   98.2 F (36.8 C) 98.4 F (36.9 C)  TempSrc:   Oral Oral  SpO2: 96% 97% 100% 97%  Weight:      Height:         Constitutional: NAD, calm  Eyes: PERTLA, lids and conjunctivae normal ENMT: Mucous membranes are moist. Posterior pharynx clear of any exudate or lesions.   Neck: supple, no masses  Respiratory: no wheezing, no crackles. No accessory muscle use.  Cardiovascular: S1 & S2 heard, regular rate and rhythm. No extremity edema.   Abdomen: Soft, no rebound pain or guarding. Bowel sounds active.  Musculoskeletal: no clubbing / cyanosis. No joint deformity upper and lower extremities.   Skin: no significant rashes, lesions, ulcers. Warm, dry, well-perfused. Neurologic: CN 2-12 grossly intact. Moving all extremities. Alert and oriented.  Psychiatric: Pleasant. Cooperative.    Labs and Imaging on  Admission: I have personally reviewed following labs and imaging studies  CBC: Recent Labs  Lab 11/15/22 1247  WBC 7.5  HGB 13.1  HCT 39.1  MCV 89.7  PLT 307   Basic Metabolic Panel: Recent Labs  Lab 11/15/22 1247  NA 136  K 4.1  CL 100  CO2 27  GLUCOSE 96  BUN 13  CREATININE 0.92  CALCIUM 9.6   GFR: Estimated Creatinine Clearance: 69.2 mL/min (by C-G formula based on SCr of 0.92 mg/dL). Liver Function Tests: Recent Labs  Lab 11/15/22 1247  AST 18  ALT 24  ALKPHOS 96  BILITOT 0.5  PROT 7.3  ALBUMIN 4.0   Recent Labs  Lab 11/15/22 1247   LIPASE 15   No results for input(s): "AMMONIA" in the last 168 hours. Coagulation Profile: No results for input(s): "INR", "PROTIME" in the last 168 hours. Cardiac Enzymes: No results for input(s): "CKTOTAL", "CKMB", "CKMBINDEX", "TROPONINI" in the last 168 hours. BNP (last 3 results) No results for input(s): "PROBNP" in the last 8760 hours. HbA1C: No results for input(s): "HGBA1C" in the last 72 hours. CBG: No results for input(s): "GLUCAP" in the last 168 hours. Lipid Profile: No results for input(s): "CHOL", "HDL", "LDLCALC", "TRIG", "CHOLHDL", "LDLDIRECT" in the last 72 hours. Thyroid Function Tests: No results for input(s): "TSH", "T4TOTAL", "FREET4", "T3FREE", "THYROIDAB" in the last 72 hours. Anemia Panel: No results for input(s): "VITAMINB12", "FOLATE", "FERRITIN", "TIBC", "IRON", "RETICCTPCT" in the last 72 hours. Urine analysis:    Component Value Date/Time   COLORURINE YELLOW 11/15/2022 1405   APPEARANCEUR CLEAR 11/15/2022 1405   LABSPEC 1.029 11/15/2022 1405   PHURINE 5.5 11/15/2022 1405   GLUCOSEU NEGATIVE 11/15/2022 1405   HGBUR NEGATIVE 11/15/2022 1405   BILIRUBINUR NEGATIVE 11/15/2022 1405   KETONESUR NEGATIVE 11/15/2022 1405   PROTEINUR TRACE (A) 11/15/2022 1405   NITRITE NEGATIVE 11/15/2022 1405   LEUKOCYTESUR MODERATE (A) 11/15/2022 1405   Sepsis Labs: @LABRCNTIP (procalcitonin:4,lacticidven:4) )No results found for this or any previous visit (from the past 240 hour(s)).   Radiological Exams on Admission: CT ABDOMEN PELVIS W CONTRAST  Result Date: 11/15/2022 CLINICAL DATA:  Left lower quadrant pain EXAM: CT ABDOMEN AND PELVIS WITH CONTRAST TECHNIQUE: Multidetector CT imaging of the abdomen and pelvis was performed using the standard protocol following bolus administration of intravenous contrast. RADIATION DOSE REDUCTION: This exam was performed according to the departmental dose-optimization program which includes automated exposure control, adjustment of  the mA and/or kV according to patient size and/or use of iterative reconstruction technique. CONTRAST:  85mL OMNIPAQUE IOHEXOL 300 MG/ML  SOLN COMPARISON:  Chest CT 05/27/2020 FINDINGS: Lower chest: No acute airspace disease or pleural effusion. Multiple right lung base pulmonary nodules, measuring up to 6 mm, no change and consistent with benign finding, no imaging follow-up is recommended. Hepatobiliary: No focal liver abnormality is seen. No gallstones, gallbladder wall thickening, or biliary dilatation. Pancreas: Unremarkable. No pancreatic ductal dilatation or surrounding inflammatory changes. Spleen: Normal in size without focal abnormality. Adrenals/Urinary Tract: Adrenal glands are unremarkable. Kidneys are normal, without renal calculi, focal lesion, or hydronephrosis. Bladder is unremarkable. Stomach/Bowel: Stomach is within normal limits. Appendix appears normal. Diverticular disease of the colon. Focal wall thickening and inflammation involving the sigmoid colon. No free air. Lower density collection along the left aspect of the inflamed sigmoid colon measuring 2.4 by 1.4 by 2 cm, series 2 image 62, potentially representing developing intramural abscess. Vascular/Lymphatic: Nonaneurysmal aorta. No suspicious lymph nodes. Mild atherosclerosis Reproductive: Status post hysterectomy. No adnexal masses. Other:  Negative for pelvic effusion or free air. Musculoskeletal: Bilateral hip replacements with artifact. No acute osseous abnormality. Degenerative changes of the spine with grade 1 anterolisthesis L2 on L3 IMPRESSION: 1. Findings consistent with acute sigmoid colon diverticulitis. 2.4 cm low-density collection along the left wall of the inflamed sigmoid colon, potentially representing developing intramural abscess. No free air. Electronically Signed   By: Jasmine Pang M.D.   On: 11/15/2022 15:31     Assessment/Plan   1. Acute diverticulitis  - Acute sigmoid diverticulitis noted on CT with possible  developing 2.4 cm intramural abscess  - Started on Zosyn in ED and surgery was consulted  - Continue Zosyn, hold pharmacologic VTE ppx pending surgical consult but anticipate the small abscess can be treated with antibiotics alone   2. Anxiety  - Continue low-dose Ativan as needed   3. Hypothyroidism  - Continue Synthroid    DVT prophylaxis: SCDs  Code Status: Full  Level of Care: Level of care: Med-Surg Family Communication: None present  Disposition Plan:  Patient is from: Home  Anticipated d/c is to: Home  Anticipated d/c date is: 11/18/22  Patient currently: Pending surgery consultation, advancement of diet  Consults called: Surgery Admission status: Inpatient     Briscoe Deutscher, MD [facility]  11/15/2022, 7:53 PM

## 2022-11-15 NOTE — ED Triage Notes (Signed)
Pt arrives to ED with c/o abdominal pain x1 week. She notes the pain is low towards her pelvis. She notes constipation, completed fleet enema yesterday.

## 2022-11-15 NOTE — Progress Notes (Signed)
Pharmacy Antibiotic Note  Lydia Osborne is a 72 y.o. female admitted on 11/15/2022 with IAI.  Pharmacy has been consulted for zosyn dosing.  Plan: Zosyn 3.375g IV q8h (4 hour infusion). Pharmacy to sign off  Height: 5\' 7"  (170.2 cm) Weight: 97.5 kg (215 lb) IBW/kg (Calculated) : 61.6  Temp (24hrs), Avg:98.2 F (36.8 C), Min:98 F (36.7 C), Max:98.4 F (36.9 C)  Recent Labs  Lab 11/15/22 1247  WBC 7.5  CREATININE 0.92    Estimated Creatinine Clearance: 69.2 mL/min (by C-G formula based on SCr of 0.92 mg/dL).    Allergies  Allergen Reactions   Sulfa Antibiotics     Extended exposure causes itchiness    Thank you for allowing pharmacy to be a part of this patient's care.  Herby Abraham, Pharm.D Use secure chat for questions 11/15/2022 7:57 PM

## 2022-11-16 DIAGNOSIS — K5792 Diverticulitis of intestine, part unspecified, without perforation or abscess without bleeding: Secondary | ICD-10-CM | POA: Diagnosis not present

## 2022-11-16 LAB — CBC
HCT: 39.3 % (ref 36.0–46.0)
Hemoglobin: 12.7 g/dL (ref 12.0–15.0)
MCH: 29.7 pg (ref 26.0–34.0)
MCHC: 32.3 g/dL (ref 30.0–36.0)
MCV: 91.8 fL (ref 80.0–100.0)
Platelets: 280 10*3/uL (ref 150–400)
RBC: 4.28 MIL/uL (ref 3.87–5.11)
RDW: 11.7 % (ref 11.5–15.5)
WBC: 4.4 10*3/uL (ref 4.0–10.5)
nRBC: 0 % (ref 0.0–0.2)

## 2022-11-16 LAB — BASIC METABOLIC PANEL
Anion gap: 8 (ref 5–15)
BUN: 12 mg/dL (ref 8–23)
CO2: 25 mmol/L (ref 22–32)
Calcium: 8.8 mg/dL — ABNORMAL LOW (ref 8.9–10.3)
Chloride: 104 mmol/L (ref 98–111)
Creatinine, Ser: 0.96 mg/dL (ref 0.44–1.00)
GFR, Estimated: >60 mL/min (ref >60)
Glucose, Bld: 108 mg/dL — ABNORMAL HIGH (ref 70–99)
Potassium: 4 mmol/L (ref 3.5–5.1)
Sodium: 137 mmol/L (ref 135–145)

## 2022-11-16 LAB — MAGNESIUM: Magnesium: 2.3 mg/dL (ref 1.7–2.4)

## 2022-11-16 LAB — HIV ANTIBODY (ROUTINE TESTING W REFLEX): HIV Screen 4th Generation wRfx: NONREACTIVE

## 2022-11-16 MED ORDER — ENOXAPARIN SODIUM 40 MG/0.4ML IJ SOSY
40.0000 mg | PREFILLED_SYRINGE | INTRAMUSCULAR | Status: DC
  Filled 2022-11-16: qty 0.4

## 2022-11-16 NOTE — Plan of Care (Signed)

## 2022-11-16 NOTE — Progress Notes (Signed)
Mobility Specialist - Progress Note   11/16/22 0848  Mobility  Activity Ambulated with assistance in hallway  Level of Assistance Independent after set-up  Assistive Device None  Distance Ambulated (ft) 1000 ft  Range of Motion/Exercises Active  Activity Response Tolerated well  Mobility Referral Yes  $Mobility charge 1 Mobility  Mobility Specialist Start Time (ACUTE ONLY) 0835  Mobility Specialist Stop Time (ACUTE ONLY) 0846  Mobility Specialist Time Calculation (min) (ACUTE ONLY) 11 min   Pt received in bed and agreed to mobility. With no c/o pain nor discomfort during session, pt returned to bed with all needs met.  Lydia Osborne Mobility Specialist

## 2022-11-16 NOTE — TOC CM/SW Note (Signed)
Transition of Care [facility]) - Inpatient Brief Assessment   Patient Details  Name: Lydia Osborne MRN: [id number] Date of Birth: [birth year]  Transition of Care [facility]) CM/SW Contact:    Otelia Santee, LCSW Phone Number: 11/16/2022, 9:10 AM   Clinical Narrative: Chart reviewed. No TOC needs identified.    Transition of Care Asessment: Insurance and Status: Insurance coverage has been reviewed Patient has primary care physician: Yes Home environment has been reviewed: Home alone Prior level of function:: Independent Prior/Current Home Services: No current home services Social Determinants of Health Reivew: SDOH reviewed no interventions necessary   Transition of care needs: no transition of care needs at this time

## 2022-11-16 NOTE — Consult Note (Addendum)
Lydia Osborne [birth year]  [id number].    Requesting MD: Renne Crigler, PA-C Chief Complaint/Reason for Consult: Diverticulitis   HPI: Lydia Osborne is a 72 y.o. female with below pmhx who presented to [facility] ED with abdominal pain.  Patient reports 10-11 days ago she began having constant, diffuse lower abdominal pain worse in the LLQ with associated decreased appetite and constipation.  Pain is gradually worsening since onset.  Denies associated fever, chills, nausea, vomiting, diarrhea, hematochezia, melena or urinary symptoms.  Movement seems to make her symptoms worse.  Nothing seems to make her symptoms better.  She reports history of known diverticulosis. Prior episode of diverticulitis x 1 ~ one year ago that was tx w/ abx. She did have similar symptoms as above 1 month ago that she managed at home and they self subsided.  Reports history of colonoscopy 2.5 weeks ago with Dr. Marca Ancona and was told this showed diverticulosis and a small polyp. No personal or family hx of IBD or colon cancer.   Prior Abdominal Surgeries: Tubal ligation. Hysterectomy Blood Thinners: None Last PO intake: Currently eating CLD Allergies: Sulfa Tobacco Use: None Alcohol Use: Occasional Substance use: None  Employment: Part time at [employer]   Denies a history of MI, CVA, asthma, or COPD. At baseline states he mobilizes without an assistive device and can climb a flight of stairs without stopping due to DOE or chest pain.  ROS: ROS As above, see hpi  Family History  Problem Relation Age of Onset   Breast cancer Mother    Thyroid disease Mother    Heart disease Father    Throat cancer Father     Past Medical History:  Diagnosis Date   Anxiety 11/15/2022   Graves' disease in remission    Hyperlipidemia     Past Surgical History:  Procedure Laterality Date   BREAST BIOPSY Right    BREAST EXCISIONAL BIOPSY Left    x2   HIP SURGERY Bilateral    KNEE SURGERY Bilateral     SHOULDER SURGERY Right     Social History:  reports that she has never smoked. She has never used smokeless tobacco. She reports current alcohol use of about 1.0 standard drink of alcohol per week. She reports that she does not use drugs.  Allergies:  Allergies  Allergen Reactions   Sulfa Antibiotics     Extended exposure causes itchiness    Medications Prior to Admission  Medication Sig Dispense Refill   ezetimibe (ZETIA) 10 MG tablet Take 10 mg by mouth daily.     levothyroxine (SYNTHROID, LEVOTHROID) 125 MCG tablet Take 125 mcg by mouth daily.  3   LORazepam (ATIVAN) 0.5 MG tablet Take 0.5 mg by mouth daily as needed for anxiety.  0   Multiple Vitamins-Minerals (MULTIVITAMIN ADULTS 50+ PO) Take 1 tablet by mouth daily.       Physical Exam: Blood pressure 120/70, pulse 71, temperature 98.4 F (36.9 C), resp. rate 20, height 5\' 7"  (1.702 m), weight 97.5 kg, SpO2 93 %. General: pleasant, WD/WN female who is laying in bed in NAD HEENT: head is normocephalic, atraumatic.  Sclera are noninjected.  PERRL.  Ears and nose without any masses or lesions.  Mouth is pink and moist. Dentition fair Heart: regular, rate, and rhythm.  Palpable pedal pulses bilaterally  Lungs: CTAB, no wheezes, rhonchi, or rales noted.  Respiratory effort nonlabored Abd:  Soft, ND, mild llq ttp without rigidity or guarding, +BS. No masses, hernias,  or organomegaly MS: no BUE or BLE edema Skin: warm and dry  Psych: A&Ox4 with an appropriate affect Neuro: cranial nerves grossly intact, normal speech, thought process intact, moves all extremities, gait not assessed   Results for orders placed or performed during the hospital encounter of 11/15/22 (from the past 48 hour(s))  Lipase, blood     Status: None   Collection Time: 11/15/22 12:47 PM  Result Value Ref Range   Lipase 15 11 - 51 U/L    Comment: Performed at [facility], [address] [place], [place] [address]  Comprehensive  metabolic panel     Status: None   Collection Time: 11/15/22 12:47 PM  Result Value Ref Range   Sodium 136 135 - 145 mmol/L   Potassium 4.1 3.5 - 5.1 mmol/L   Chloride 100 98 - 111 mmol/L   CO2 27 22 - 32 mmol/L   Glucose, Bld 96 70 - 99 mg/dL    Comment: Glucose reference range applies only to samples taken after fasting for at least 8 hours.   BUN 13 8 - 23 mg/dL   Creatinine, Ser 9.81 0.44 - 1.00 mg/dL   Calcium 9.6 8.9 - 19.1 mg/dL   Total Protein 7.3 6.5 - 8.1 g/dL   Albumin 4.0 3.5 - 5.0 g/dL   AST 18 15 - 41 U/L   ALT 24 0 - 44 U/L   Alkaline Phosphatase 96 38 - 126 U/L   Total Bilirubin 0.5 0.3 - 1.2 mg/dL   GFR, Estimated >47 >82 mL/min    Comment: (NOTE) Calculated using the CKD-EPI Creatinine Equation (2021)    Anion gap 9 5 - 15    Comment: Performed at [facility], [address] [place], [place] [address]  CBC     Status: None   Collection Time: 11/15/22 12:47 PM  Result Value Ref Range   WBC 7.5 4.0 - 10.5 K/uL   RBC 4.36 3.87 - 5.11 MIL/uL   Hemoglobin 13.1 12.0 - 15.0 g/dL   HCT 30.8 65.7 - 84.6 %   MCV 89.7 80.0 - 100.0 fL   MCH 30.0 26.0 - 34.0 pg   MCHC 33.5 30.0 - 36.0 g/dL   RDW 96.2 95.2 - 84.1 %   Platelets 307 150 - 400 K/uL   nRBC 0.0 0.0 - 0.2 %    Comment: Performed at [facility], [address], [place], [place] [address]  Urinalysis, Routine w reflex microscopic -Urine, Clean Catch     Status: Abnormal   Collection Time: 11/15/22  2:05 PM  Result Value Ref Range   Color, Urine YELLOW YELLOW   APPearance CLEAR CLEAR   Specific Gravity, Urine 1.029 1.005 - 1.030   pH 5.5 5.0 - 8.0   Glucose, UA NEGATIVE NEGATIVE mg/dL   Hgb urine dipstick NEGATIVE NEGATIVE   Bilirubin Urine NEGATIVE NEGATIVE   Ketones, ur NEGATIVE NEGATIVE mg/dL   Protein, ur TRACE (A) NEGATIVE mg/dL   Nitrite NEGATIVE NEGATIVE   Leukocytes,Ua MODERATE (A) NEGATIVE   RBC / HPF 0-5 0 - 5 RBC/hpf   WBC, UA 21-50 0 - 5 WBC/hpf    Bacteria, UA NONE SEEN NONE SEEN   Squamous Epithelial / HPF 0-5 0 - 5 /HPF   Mucus PRESENT     Comment: Performed at [facility], [address] [place], [place] [address]  Basic metabolic panel     Status: Abnormal   Collection Time: 11/16/22  5:37 AM  Result Value  Ref Range   Sodium 137 135 - 145 mmol/L   Potassium 4.0 3.5 - 5.1 mmol/L   Chloride 104 98 - 111 mmol/L   CO2 25 22 - 32 mmol/L   Glucose, Bld 108 (H) 70 - 99 mg/dL    Comment: Glucose reference range applies only to samples taken after fasting for at least 8 hours.   BUN 12 8 - 23 mg/dL   Creatinine, Ser 2.95 0.44 - 1.00 mg/dL   Calcium 8.8 (L) 8.9 - 10.3 mg/dL   GFR, Estimated >62 >13 mL/min    Comment: (NOTE) Calculated using the CKD-EPI Creatinine Equation (2021)    Anion gap 8 5 - 15    Comment: Performed at [facility], [address] [place], [place] [address]  Magnesium     Status: None   Collection Time: 11/16/22  5:37 AM  Result Value Ref Range   Magnesium 2.3 1.7 - 2.4 mg/dL    Comment: Performed at [facility], [address], [place], [place] [address]  CBC     Status: None   Collection Time: 11/16/22  5:37 AM  Result Value Ref Range   WBC 4.4 4.0 - 10.5 K/uL   RBC 4.28 3.87 - 5.11 MIL/uL   Hemoglobin 12.7 12.0 - 15.0 g/dL   HCT 29.5 28.4 - 13.2 %   MCV 91.8 80.0 - 100.0 fL   MCH 29.7 26.0 - 34.0 pg   MCHC 32.3 30.0 - 36.0 g/dL   RDW 44.0 10.2 - 72.5 %   Platelets 280 150 - 400 K/uL   nRBC 0.0 0.0 - 0.2 %    Comment: Performed at [facility], [address], [place], [place] [address]   CT ABDOMEN PELVIS W CONTRAST  Result Date: 11/15/2022 CLINICAL DATA:  Left lower quadrant pain EXAM: CT ABDOMEN AND PELVIS WITH CONTRAST TECHNIQUE: Multidetector CT imaging of the abdomen and pelvis was performed using the standard protocol following bolus administration of intravenous contrast. RADIATION DOSE REDUCTION: This exam  was performed according to the departmental dose-optimization program which includes automated exposure control, adjustment of the mA and/or kV according to patient size and/or use of iterative reconstruction technique. CONTRAST:  85mL OMNIPAQUE IOHEXOL 300 MG/ML  SOLN COMPARISON:  Chest CT 05/27/2020 FINDINGS: Lower chest: No acute airspace disease or pleural effusion. Multiple right lung base pulmonary nodules, measuring up to 6 mm, no change and consistent with benign finding, no imaging follow-up is recommended. Hepatobiliary: No focal liver abnormality is seen. No gallstones, gallbladder wall thickening, or biliary dilatation. Pancreas: Unremarkable. No pancreatic ductal dilatation or surrounding inflammatory changes. Spleen: Normal in size without focal abnormality. Adrenals/Urinary Tract: Adrenal glands are unremarkable. Kidneys are normal, without renal calculi, focal lesion, or hydronephrosis. Bladder is unremarkable. Stomach/Bowel: Stomach is within normal limits. Appendix appears normal. Diverticular disease of the colon. Focal wall thickening and inflammation involving the sigmoid colon. No free air. Lower density collection along the left aspect of the inflamed sigmoid colon measuring 2.4 by 1.4 by 2 cm, series 2 image 62, potentially representing developing intramural abscess. Vascular/Lymphatic: Nonaneurysmal aorta. No suspicious lymph nodes. Mild atherosclerosis Reproductive: Status post hysterectomy. No adnexal masses. Other: Negative for pelvic effusion or free air. Musculoskeletal: Bilateral hip replacements with artifact. No acute osseous abnormality. Degenerative changes of the spine with grade 1 anterolisthesis L2 on L3 IMPRESSION: 1. Findings consistent with acute sigmoid colon diverticulitis. 2.4 cm low-density collection along the left wall of the inflamed sigmoid colon, potentially  representing developing intramural abscess. No free air. Electronically Signed   By: Jasmine Pang M.D.   On:  11/15/2022 15:31    Anti-infectives (From admission, onward)    Start     Dose/Rate Route Frequency Ordered Stop   11/16/22 0000  piperacillin-tazobactam (ZOSYN) IVPB 3.375 g        3.375 g 12.5 mL/hr over 240 Minutes Intravenous Every 8 hours 11/15/22 1955     11/15/22 1600  piperacillin-tazobactam (ZOSYN) IVPB 3.375 g        3.375 g 12.5 mL/hr over 240 Minutes Intravenous  Once 11/15/22 1554 11/15/22 1900       Assessment/Plan Sigmoid Diverticulitis with intramural abscess - CT w/ acute sigmoid colon diverticulitis with 2.4cm intramural abscess. - HDS without fever, tachycardia or hypotension. No peritonitis on exam. WBC wnl. No current indication for emergency surgery - Given fluid collection appears intramural, would not recommended IR drainage - Agree with IV antibiotics - Currently on CLD. Doing well with minimal pain/ttp in the lower abdomen. Consider FLD this pm.  - Hopefully patient will improve with conservative treatment.  If patient fails to improve they may require repeating imaging, drain placement, or surgical intervention resulting in a colectomy/colostomy.  This was discussed with the patient. - Patient reports last colonoscopy was 2.5 weeks ago with Dr. Marca Ancona that showed diverticulosis with small polyp that was removed. She has not heard back path for this. I do not see results in [facility] Epic. Will see if we can locate these results.  - We will follow with you  FEN - CLD, IVF per [facility] VTE - SCDs, okay with chem ppx from a general surgery standpoint ID - Zosyn  I reviewed nursing notes, ED provider notes, last 24 h vitals and pain scores, last 48 h intake and output, last 24 h labs and trends, and last 24 h imaging results.  Jacinto Halim, [facility] 11/16/2022, 9:27 AM Please see Amion for pager number during day hours 7:00am-4:30pm

## 2022-11-16 NOTE — Progress Notes (Signed)
PROGRESS NOTE    Lydia Osborne  OZH:[id number] DOB: [birth year] DOA: 11/15/2022 PCP: Darrow Bussing, MD  Outpatient Specialists:     Brief Narrative:  Patient is a 72 year old female with past medical history significant for diverticulosis, diverticulitis, hypothyroidism, anxiety and hyperlipidemia.  Patient presents with 10-day history of lower abdominal pain.  No associated nausea or vomiting.  No associated fever or chills.  No associated diarrhea.  CT scan of the abdomen and pelvis with contrast done on presentation revealed " acute sigmoid colon diverticulitis. 2.4 cm low-density collection along the left wall of the inflamed sigmoid colon, potentially representing developing intramural abscess. No free air".  Patient is currently on IV Zosyn.  11/16/2022: Patient seen.  Patient seems to be improving.  Surgical team's input is appreciated.  Will continue IV antibiotics for now.   Assessment & Plan:   Principal Problem:   Acute diverticulitis Active Problems:   Anxiety   Hypothyroidism   1. Acute sigmoid diverticulitis: -Continue IV Zosyn. -CT abdomen/pelvis findings as documented above. -Surgical input is appreciated. -Patient seems to be improving. -Patient will be managed expectantly.     2. Anxiety  - Continue low-dose Ativan as needed    3. Hypothyroidism  - Continue Synthroid   4.  Obesity: -Diet and exercise. -BMI of 33.67 kg/m.   DVT prophylaxis: Subcutaneous Lovenox. Code Status: Full code. Family Communication: Daughter. Disposition Plan: Home eventually.   Consultants:  Surgery team.  Procedures:  None.  Antimicrobials:  IV Zosyn.   Subjective: -Abdominal pain is improving. -No fever or chills. -No nausea or vomiting. -No diarrhea.  Objective: Vitals:   11/15/22 2250 11/16/22 0200 11/16/22 0542 11/16/22 1407  BP: 110/63 111/68 120/70 111/70  Pulse: 74 71 71 74  Resp: 18 18 20 17   Temp: 98.2 F (36.8 C) 98.2 F (36.8 C)  98.4 F (36.9 C) 98.1 F (36.7 C)  TempSrc: Oral   Oral  SpO2: 94% 95% 93% 96%  Weight:      Height:        Intake/Output Summary (Last 24 hours) at 11/16/2022 1659 Last data filed at 11/16/2022 0432 Gross per 24 hour  Intake 344.22 ml  Output --  Net 344.22 ml   Filed Weights   11/15/22 1246  Weight: 97.5 kg    Examination:  General exam: Appears calm and comfortable  Respiratory system: Clear to auscultation.  Cardiovascular system: S1 & S2 heard. Gastrointestinal system: Abdomen is obese, soft and nontender.  Central nervous system: Awake and alert.  Patient moves all extremities.  Extremities: No leg edema.  Data Reviewed: I have personally reviewed following labs and imaging studies  CBC: Recent Labs  Lab 11/15/22 1247 11/16/22 0537  WBC 7.5 4.4  HGB 13.1 12.7  HCT 39.1 39.3  MCV 89.7 91.8  PLT 307 280   Basic Metabolic Panel: Recent Labs  Lab 11/15/22 1247 11/16/22 0537  NA 136 137  K 4.1 4.0  CL 100 104  CO2 27 25  GLUCOSE 96 108*  BUN 13 12  CREATININE 0.92 0.96  CALCIUM 9.6 8.8*  MG  --  2.3   GFR: Estimated Creatinine Clearance: 66.4 mL/min (by C-G formula based on SCr of 0.96 mg/dL). Liver Function Tests: Recent Labs  Lab 11/15/22 1247  AST 18  ALT 24  ALKPHOS 96  BILITOT 0.5  PROT 7.3  ALBUMIN 4.0   Recent Labs  Lab 11/15/22 1247  LIPASE 15   No results for input(s): "AMMONIA" in the last 168  hours. Coagulation Profile: No results for input(s): "INR", "PROTIME" in the last 168 hours. Cardiac Enzymes: No results for input(s): "CKTOTAL", "CKMB", "CKMBINDEX", "TROPONINI" in the last 168 hours. BNP (last 3 results) No results for input(s): "PROBNP" in the last 8760 hours. HbA1C: No results for input(s): "HGBA1C" in the last 72 hours. CBG: No results for input(s): "GLUCAP" in the last 168 hours. Lipid Profile: No results for input(s): "CHOL", "HDL", "LDLCALC", "TRIG", "CHOLHDL", "LDLDIRECT" in the last 72 hours. Thyroid  Function Tests: No results for input(s): "TSH", "T4TOTAL", "FREET4", "T3FREE", "THYROIDAB" in the last 72 hours. Anemia Panel: No results for input(s): "VITAMINB12", "FOLATE", "FERRITIN", "TIBC", "IRON", "RETICCTPCT" in the last 72 hours. Urine analysis:    Component Value Date/Time   COLORURINE YELLOW 11/15/2022 1405   APPEARANCEUR CLEAR 11/15/2022 1405   LABSPEC 1.029 11/15/2022 1405   PHURINE 5.5 11/15/2022 1405   GLUCOSEU NEGATIVE 11/15/2022 1405   HGBUR NEGATIVE 11/15/2022 1405   BILIRUBINUR NEGATIVE 11/15/2022 1405   KETONESUR NEGATIVE 11/15/2022 1405   PROTEINUR TRACE (A) 11/15/2022 1405   NITRITE NEGATIVE 11/15/2022 1405   LEUKOCYTESUR MODERATE (A) 11/15/2022 1405   Sepsis Labs: @LABRCNTIP (procalcitonin:4,lacticidven:4)  )No results found for this or any previous visit (from the past 240 hour(s)).       Radiology Studies: CT ABDOMEN PELVIS W CONTRAST  Result Date: 11/15/2022 CLINICAL DATA:  Left lower quadrant pain EXAM: CT ABDOMEN AND PELVIS WITH CONTRAST TECHNIQUE: Multidetector CT imaging of the abdomen and pelvis was performed using the standard protocol following bolus administration of intravenous contrast. RADIATION DOSE REDUCTION: This exam was performed according to the departmental dose-optimization program which includes automated exposure control, adjustment of the mA and/or kV according to patient size and/or use of iterative reconstruction technique. CONTRAST:  85mL OMNIPAQUE IOHEXOL 300 MG/ML  SOLN COMPARISON:  Chest CT 05/27/2020 FINDINGS: Lower chest: No acute airspace disease or pleural effusion. Multiple right lung base pulmonary nodules, measuring up to 6 mm, no change and consistent with benign finding, no imaging follow-up is recommended. Hepatobiliary: No focal liver abnormality is seen. No gallstones, gallbladder wall thickening, or biliary dilatation. Pancreas: Unremarkable. No pancreatic ductal dilatation or surrounding inflammatory changes. Spleen:  Normal in size without focal abnormality. Adrenals/Urinary Tract: Adrenal glands are unremarkable. Kidneys are normal, without renal calculi, focal lesion, or hydronephrosis. Bladder is unremarkable. Stomach/Bowel: Stomach is within normal limits. Appendix appears normal. Diverticular disease of the colon. Focal wall thickening and inflammation involving the sigmoid colon. No free air. Lower density collection along the left aspect of the inflamed sigmoid colon measuring 2.4 by 1.4 by 2 cm, series 2 image 62, potentially representing developing intramural abscess. Vascular/Lymphatic: Nonaneurysmal aorta. No suspicious lymph nodes. Mild atherosclerosis Reproductive: Status post hysterectomy. No adnexal masses. Other: Negative for pelvic effusion or free air. Musculoskeletal: Bilateral hip replacements with artifact. No acute osseous abnormality. Degenerative changes of the spine with grade 1 anterolisthesis L2 on L3 IMPRESSION: 1. Findings consistent with acute sigmoid colon diverticulitis. 2.4 cm low-density collection along the left wall of the inflamed sigmoid colon, potentially representing developing intramural abscess. No free air. Electronically Signed   By: Jasmine Pang M.D.   On: 11/15/2022 15:31        Scheduled Meds:  levothyroxine  125 mcg Oral Q0600   Continuous Infusions:  piperacillin-tazobactam (ZOSYN)  IV 3.375 g (11/16/22 1634)     LOS: 1 day    Time spent: 35 minutes.    Berton Mount, MD  [facility] Pager #: [phone] 7PM-7AM  contact night coverage as above

## 2022-11-17 DIAGNOSIS — K5792 Diverticulitis of intestine, part unspecified, without perforation or abscess without bleeding: Secondary | ICD-10-CM | POA: Diagnosis not present

## 2022-11-17 LAB — RENAL FUNCTION PANEL
Albumin: 3.4 g/dL — ABNORMAL LOW (ref 3.5–5.0)
Anion gap: 7 (ref 5–15)
BUN: 9 mg/dL (ref 8–23)
CO2: 27 mmol/L (ref 22–32)
Calcium: 8.8 mg/dL — ABNORMAL LOW (ref 8.9–10.3)
Chloride: 104 mmol/L (ref 98–111)
Creatinine, Ser: 1.05 mg/dL — ABNORMAL HIGH (ref 0.44–1.00)
GFR, Estimated: 58 mL/min — ABNORMAL LOW (ref >60)
Glucose, Bld: 104 mg/dL — ABNORMAL HIGH (ref 70–99)
Phosphorus: 4.2 mg/dL (ref 2.5–4.6)
Potassium: 4 mmol/L (ref 3.5–5.1)
Sodium: 138 mmol/L (ref 135–145)

## 2022-11-17 LAB — CBC WITH DIFFERENTIAL/PLATELET
Abs Immature Granulocytes: 0.08 10*3/uL — ABNORMAL HIGH (ref 0.00–0.07)
Basophils Absolute: 0.1 10*3/uL (ref 0.0–0.1)
Basophils Relative: 1 %
Eosinophils Absolute: 0.1 10*3/uL (ref 0.0–0.5)
Eosinophils Relative: 3 %
HCT: 40.9 % (ref 36.0–46.0)
Hemoglobin: 13.3 g/dL (ref 12.0–15.0)
Immature Granulocytes: 2 %
Lymphocytes Relative: 26 %
Lymphs Abs: 1.2 10*3/uL (ref 0.7–4.0)
MCH: 30 pg (ref 26.0–34.0)
MCHC: 32.5 g/dL (ref 30.0–36.0)
MCV: 92.1 fL (ref 80.0–100.0)
Monocytes Absolute: 0.7 10*3/uL (ref 0.1–1.0)
Monocytes Relative: 16 %
Neutro Abs: 2.2 10*3/uL (ref 1.7–7.7)
Neutrophils Relative %: 52 %
Platelets: 309 10*3/uL (ref 150–400)
RBC: 4.44 MIL/uL (ref 3.87–5.11)
RDW: 11.8 % (ref 11.5–15.5)
WBC: 4.4 10*3/uL (ref 4.0–10.5)
nRBC: 0 % (ref 0.0–0.2)

## 2022-11-17 LAB — BASIC METABOLIC PANEL
Anion gap: 8 (ref 5–15)
BUN: 9 mg/dL (ref 8–23)
CO2: 27 mmol/L (ref 22–32)
Calcium: 8.9 mg/dL (ref 8.9–10.3)
Chloride: 102 mmol/L (ref 98–111)
Creatinine, Ser: 1.01 mg/dL — ABNORMAL HIGH (ref 0.44–1.00)
GFR, Estimated: >60 mL/min (ref >60)
Glucose, Bld: 104 mg/dL — ABNORMAL HIGH (ref 70–99)
Potassium: 3.9 mmol/L (ref 3.5–5.1)
Sodium: 137 mmol/L (ref 135–145)

## 2022-11-17 MED ORDER — SODIUM CHLORIDE 0.9 % IV SOLN: INTRAVENOUS | Status: AC

## 2022-11-17 NOTE — Progress Notes (Signed)
Mobility Specialist - Progress Note   11/17/22 0845  Mobility  Activity Ambulated with assistance in hallway  Level of Assistance Independent after set-up  Assistive Device None  Distance Ambulated (ft) 1000 ft  Range of Motion/Exercises Active  Activity Response Tolerated well  Mobility Referral Yes  $Mobility charge 1 Mobility  Mobility Specialist Start Time (ACUTE ONLY) 0830  Mobility Specialist Stop Time (ACUTE ONLY) 0840  Mobility Specialist Time Calculation (min) (ACUTE ONLY) 10 min   Pt received in chair and agreed to mobility. Had no issues throughout session, returned to chair with all needs met.  Marilynne Halsted Mobility Specialist

## 2022-11-17 NOTE — Progress Notes (Signed)
Subjective: CC: LLQ pain improved. No pain at rest. Some pain with movement. Tolerating fld without n/v. BM yesterday that was non-bloody and non-melanous. Voiding without issues.   Afebrile. No tachycardia or hypotension. WBC wnl at 4.4  Objective: Vital signs in last 24 hours: Temp:  [97.9 F (36.6 C)-98.6 F (37 C)] 97.9 F (36.6 C) (06/19 0452) Pulse Rate:  [68-75] 68 (06/19 0452) Resp:  [17-18] 18 (06/19 0452) BP: (111-119)/(70-72) 119/72 (06/19 0452) SpO2:  [95 %-97 %] 95 % (06/19 0452) Last BM Date : 11/08/22  Intake/Output from previous day: 06/18 0701 - 06/19 0700 In: 162 [IV Piggyback:162] Out: -  Intake/Output this shift: No intake/output data recorded.  PE: Gen:  Alert, NAD, pleasant Pulm:  Rate and effort normal Abd: Soft, ND, NT, +BS,  Psych: A&Ox3   Lab Results:  Recent Labs    11/16/22 0537 11/17/22 0553  WBC 4.4 4.4  HGB 12.7 13.3  HCT 39.3 40.9  PLT 280 309   BMET Recent Labs    11/17/22 0553 11/17/22 0626  NA 137 138  K 3.9 4.0  CL 102 104  CO2 27 27  GLUCOSE 104* 104*  BUN 9 9  CREATININE 1.01* 1.05*  CALCIUM 8.9 8.8*   PT/INR No results for input(s): "LABPROT", "INR" in the last 72 hours. CMP     Component Value Date/Time   NA 138 11/17/2022 0626   K 4.0 11/17/2022 0626   CL 104 11/17/2022 0626   CO2 27 11/17/2022 0626   GLUCOSE 104 (H) 11/17/2022 0626   BUN 9 11/17/2022 0626   CREATININE 1.05 (H) 11/17/2022 0626   CALCIUM 8.8 (L) 11/17/2022 0626   PROT 7.3 11/15/2022 1247   ALBUMIN 3.4 (L) 11/17/2022 0626   AST 18 11/15/2022 1247   ALT 24 11/15/2022 1247   ALKPHOS 96 11/15/2022 1247   BILITOT 0.5 11/15/2022 1247   GFRNONAA 58 (L) 11/17/2022 0626   GFRAA >60 01/14/2018 0940   Lipase     Component Value Date/Time   LIPASE 15 11/15/2022 1247    Studies/Results: CT ABDOMEN PELVIS W CONTRAST  Result Date: 11/15/2022 CLINICAL DATA:  Left lower quadrant pain EXAM: CT ABDOMEN AND PELVIS WITH CONTRAST  TECHNIQUE: Multidetector CT imaging of the abdomen and pelvis was performed using the standard protocol following bolus administration of intravenous contrast. RADIATION DOSE REDUCTION: This exam was performed according to the departmental dose-optimization program which includes automated exposure control, adjustment of the mA and/or kV according to patient size and/or use of iterative reconstruction technique. CONTRAST:  85mL OMNIPAQUE IOHEXOL 300 MG/ML  SOLN COMPARISON:  Chest CT 05/27/2020 FINDINGS: Lower chest: No acute airspace disease or pleural effusion. Multiple right lung base pulmonary nodules, measuring up to 6 mm, no change and consistent with benign finding, no imaging follow-up is recommended. Hepatobiliary: No focal liver abnormality is seen. No gallstones, gallbladder wall thickening, or biliary dilatation. Pancreas: Unremarkable. No pancreatic ductal dilatation or surrounding inflammatory changes. Spleen: Normal in size without focal abnormality. Adrenals/Urinary Tract: Adrenal glands are unremarkable. Kidneys are normal, without renal calculi, focal lesion, or hydronephrosis. Bladder is unremarkable. Stomach/Bowel: Stomach is within normal limits. Appendix appears normal. Diverticular disease of the colon. Focal wall thickening and inflammation involving the sigmoid colon. No free air. Lower density collection along the left aspect of the inflamed sigmoid colon measuring 2.4 by 1.4 by 2 cm, series 2 image 62, potentially representing developing intramural abscess. Vascular/Lymphatic: Nonaneurysmal aorta. No suspicious lymph nodes. Mild atherosclerosis  Reproductive: Status post hysterectomy. No adnexal masses. Other: Negative for pelvic effusion or free air. Musculoskeletal: Bilateral hip replacements with artifact. No acute osseous abnormality. Degenerative changes of the spine with grade 1 anterolisthesis L2 on L3 IMPRESSION: 1. Findings consistent with acute sigmoid colon diverticulitis. 2.4 cm  low-density collection along the left wall of the inflamed sigmoid colon, potentially representing developing intramural abscess. No free air. Electronically Signed   By: Jasmine Pang M.D.   On: 11/15/2022 15:31    Anti-infectives: Anti-infectives (From admission, onward)    Start     Dose/Rate Route Frequency Ordered Stop   11/16/22 0000  piperacillin-tazobactam (ZOSYN) IVPB 3.375 g        3.375 g 12.5 mL/hr over 240 Minutes Intravenous Every 8 hours 11/15/22 1955     11/15/22 1600  piperacillin-tazobactam (ZOSYN) IVPB 3.375 g        3.375 g 12.5 mL/hr over 240 Minutes Intravenous  Once 11/15/22 1554 11/15/22 1900        Assessment/Plan Sigmoid Diverticulitis with intramural abscess - CT 6/17 w/ acute sigmoid colon diverticulitis with 2.4cm intramural abscess. - HDS without fever, tachycardia or hypotension. No peritonitis on exam. WBC wnl. No current indication for emergency surgery - Given fluid collection appears intramural, would not recommended IR drainage - Patient reports last colonoscopy was 2.5 weeks ago with Dr. Marca Ancona that showed diverticulosis with small polyp that was removed. She has not heard back path for this. I do not see results in [facility] Epic. Per Dr. Dulce Sellar this was a small 4mm hyperplastic polyp.  - Patient appears to be improving with conservative management. Adv to soft diet. If tolerates, okay for d/c from our standpoint. Would complete 14d of abx total (Augmentin). Discussed return precautions. Will let primary team know.    FEN - Soft, IVF per [facility] VTE - SCDs, Lovenox ID - Zosyn  I reviewed nursing notes, last 24 h vitals and pain scores, last 48 h intake and output, last 24 h labs and trends, and last 24 h imaging results.   LOS: 2 days    Jacinto Halim , [facility] 11/17/2022, 8:11 AM Please see Amion for pager number during day hours 7:00am-4:30pm

## 2022-11-17 NOTE — Plan of Care (Signed)

## 2022-11-17 NOTE — Progress Notes (Signed)
PROGRESS NOTE    Lydia Osborne  XBJ:[id number] DOB: 1950/06/26 DOA: 11/15/2022 PCP: Darrow Bussing, MD      Brief Narrative:  72 year old female with past medical history significant for diverticulosis, diverticulitis, hypothyroidism, anxiety and hyperlipidemia.  Patient presents with 10-day history of lower abdominal pain. CT scan a/p revealed " acute sigmoid colon diverticulitis. 2.4 cm low-density collection along the left wall of the inflamed sigmoid colon, potentially representing developing intramural abscess. No free air".  Patient is currently on IV Zosyn.   Assessment & Plan:   Principal Problem:   Acute diverticulitis Active Problems:   Anxiety   Hypothyroidism   Acute sigmoid diverticulitis with intramural abscess Afebrile, with no leukocytosis CT abdomen/pelvis as noted above Continue IV Zosyn Gen surgery on board, appreciate recs  Mild AKI Continue IVF   Anxiety  Continue low-dose Ativan as needed    Hypothyroidism  Continue Synthroid   Obesity Lifestyle modification advised    DVT prophylaxis: Subcutaneous Lovenox. Code Status: Full code. Family Communication: None at bedside Disposition Plan: Home on 6/20   Consultants:  Surgery team.  Procedures:  None.  Antimicrobials:  IV Zosyn.   Subjective: Abdominal pain improved, was able to tolerate diet. Noted some mild AKI  Objective: Vitals:   11/16/22 1407 11/16/22 2003 11/17/22 0452 11/17/22 1210  BP: 111/70 116/70 119/72 119/71  Pulse: 74 75 68 77  Resp: 17 18 18 17   Temp: 98.1 F (36.7 C) 98.6 F (37 C) 97.9 F (36.6 C) 98.2 F (36.8 C)  TempSrc: Oral   Oral  SpO2: 96% 97% 95% 97%  Weight:      Height:        Intake/Output Summary (Last 24 hours) at 11/17/2022 1751 Last data filed at 11/17/2022 1500 Gross per 24 hour  Intake 852.01 ml  Output --  Net 852.01 ml   Filed Weights   11/15/22 1246  Weight: 97.5 kg    Examination: General: NAD  Cardiovascular: S1,  S2 present Respiratory: CTAB Abdomen: Soft, mildly tender, nondistended, bowel sounds present Musculoskeletal: No bilateral pedal edema noted Skin: Normal Psychiatry: Normal mood    Data Reviewed: I have personally reviewed following labs and imaging studies  CBC: Recent Labs  Lab 11/15/22 1247 11/16/22 0537 11/17/22 0553  WBC 7.5 4.4 4.4  NEUTROABS  --   --  2.2  HGB 13.1 12.7 13.3  HCT 39.1 39.3 40.9  MCV 89.7 91.8 92.1  PLT 307 280 309   Basic Metabolic Panel: Recent Labs  Lab 11/15/22 1247 11/16/22 0537 11/17/22 0553 11/17/22 0626  NA 136 137 137 138  K 4.1 4.0 3.9 4.0  CL 100 104 102 104  CO2 27 25 27 27   GLUCOSE 96 108* 104* 104*  BUN 13 12 9 9   CREATININE 0.92 0.96 1.01* 1.05*  CALCIUM 9.6 8.8* 8.9 8.8*  MG  --  2.3  --   --   PHOS  --   --   --  4.2   GFR: Estimated Creatinine Clearance: 60.7 mL/min (A) (by C-G formula based on SCr of 1.05 mg/dL (H)). Liver Function Tests: Recent Labs  Lab 11/15/22 1247 11/17/22 0626  AST 18  --   ALT 24  --   ALKPHOS 96  --   BILITOT 0.5  --   PROT 7.3  --   ALBUMIN 4.0 3.4*   Recent Labs  Lab 11/15/22 1247  LIPASE 15   No results for input(s): "AMMONIA" in the last 168 hours. Coagulation Profile:  No results for input(s): "INR", "PROTIME" in the last 168 hours. Cardiac Enzymes: No results for input(s): "CKTOTAL", "CKMB", "CKMBINDEX", "TROPONINI" in the last 168 hours. BNP (last 3 results) No results for input(s): "PROBNP" in the last 8760 hours. HbA1C: No results for input(s): "HGBA1C" in the last 72 hours. CBG: No results for input(s): "GLUCAP" in the last 168 hours. Lipid Profile: No results for input(s): "CHOL", "HDL", "LDLCALC", "TRIG", "CHOLHDL", "LDLDIRECT" in the last 72 hours. Thyroid Function Tests: No results for input(s): "TSH", "T4TOTAL", "FREET4", "T3FREE", "THYROIDAB" in the last 72 hours. Anemia Panel: No results for input(s): "VITAMINB12", "FOLATE", "FERRITIN", "TIBC", "IRON",  "RETICCTPCT" in the last 72 hours. Urine analysis:    Component Value Date/Time   COLORURINE YELLOW 11/15/2022 1405   APPEARANCEUR CLEAR 11/15/2022 1405   LABSPEC 1.029 11/15/2022 1405   PHURINE 5.5 11/15/2022 1405   GLUCOSEU NEGATIVE 11/15/2022 1405   HGBUR NEGATIVE 11/15/2022 1405   BILIRUBINUR NEGATIVE 11/15/2022 1405   KETONESUR NEGATIVE 11/15/2022 1405   PROTEINUR TRACE (A) 11/15/2022 1405   NITRITE NEGATIVE 11/15/2022 1405   LEUKOCYTESUR MODERATE (A) 11/15/2022 1405   Sepsis Labs: @LABRCNTIP (procalcitonin:4,lacticidven:4)  )No results found for this or any previous visit (from the past 240 hour(s)).       Radiology Studies: No results found.      Scheduled Meds:  enoxaparin (LOVENOX) injection  40 mg Subcutaneous Q24H   levothyroxine  125 mcg Oral Q0600   Continuous Infusions:  sodium chloride 75 mL/hr at 11/17/22 0804   piperacillin-tazobactam (ZOSYN)  IV 3.375 g (11/17/22 1626)     LOS: 2 days     Andreas Newport, MD [facility]  7PM-7AM contact night coverage as above

## 2022-11-18 DIAGNOSIS — K5792 Diverticulitis of intestine, part unspecified, without perforation or abscess without bleeding: Secondary | ICD-10-CM | POA: Diagnosis not present

## 2022-11-18 LAB — BASIC METABOLIC PANEL
Anion gap: 6 (ref 5–15)
BUN: 10 mg/dL (ref 8–23)
CO2: 29 mmol/L (ref 22–32)
Calcium: 9.1 mg/dL (ref 8.9–10.3)
Chloride: 105 mmol/L (ref 98–111)
Creatinine, Ser: 1.05 mg/dL — ABNORMAL HIGH (ref 0.44–1.00)
GFR, Estimated: 58 mL/min — ABNORMAL LOW (ref >60)
Glucose, Bld: 107 mg/dL — ABNORMAL HIGH (ref 70–99)
Potassium: 4.4 mmol/L (ref 3.5–5.1)
Sodium: 140 mmol/L (ref 135–145)

## 2022-11-18 LAB — CBC
HCT: 42.9 % (ref 36.0–46.0)
Hemoglobin: 13.8 g/dL (ref 12.0–15.0)
MCH: 30 pg (ref 26.0–34.0)
MCHC: 32.2 g/dL (ref 30.0–36.0)
MCV: 93.3 fL (ref 80.0–100.0)
Platelets: 300 10*3/uL (ref 150–400)
RBC: 4.6 MIL/uL (ref 3.87–5.11)
RDW: 11.8 % (ref 11.5–15.5)
WBC: 4.1 10*3/uL (ref 4.0–10.5)
nRBC: 0 % (ref 0.0–0.2)

## 2022-11-18 MED ORDER — AMOXICILLIN-POT CLAVULANATE 875-125 MG PO TABS: 1.0000 | ORAL_TABLET | Freq: Two times a day (BID) | ORAL | 0 refills | Status: AC

## 2022-11-18 MED ORDER — AMOXICILLIN-POT CLAVULANATE 875-125 MG PO TABS
1.0000 | ORAL_TABLET | Freq: Two times a day (BID) | ORAL | Status: DC
  Administered 2022-11-18: 1 via ORAL
  Filled 2022-11-18: qty 1

## 2022-11-18 NOTE — Progress Notes (Signed)
Subjective: CC: Abdominal pain resolved. Tolerating fld without n/v. No PRN pain or anti-nauea medication yesterday or today. BM yesterday.   Afebrile. No tachycardia or hypotension. WBC wnl at 4.1  Objective: Vital signs in last 24 hours: Temp:  [97.8 F (36.6 C)-98.2 F (36.8 C)] 97.8 F (36.6 C) (06/20 0547) Pulse Rate:  [63-77] 63 (06/20 0547) Resp:  [16-17] 16 (06/20 0547) BP: (98-119)/(62-71) 98/62 (06/20 0547) SpO2:  [93 %-99 %] 93 % (06/20 0547) Last BM Date : 11/16/22  Intake/Output from previous day: 06/19 0701 - 06/20 0700 In: 690 [P.O.:120; I.V.:520; IV Piggyback:50] Out: -  Intake/Output this shift: No intake/output data recorded.  PE: Gen:  Alert, NAD, pleasant Pulm:  Rate and effort normal Abd: Soft, ND, NT, +BS,  Psych: A&Ox3   Lab Results:  Recent Labs    11/17/22 0553 11/18/22 0534  WBC 4.4 4.1  HGB 13.3 13.8  HCT 40.9 42.9  PLT 309 300    BMET Recent Labs    11/17/22 0626 11/18/22 0534  NA 138 140  K 4.0 4.4  CL 104 105  CO2 27 29  GLUCOSE 104* 107*  BUN 9 10  CREATININE 1.05* 1.05*  CALCIUM 8.8* 9.1    PT/INR No results for input(s): "LABPROT", "INR" in the last 72 hours. CMP     Component Value Date/Time   NA 140 11/18/2022 0534   K 4.4 11/18/2022 0534   CL 105 11/18/2022 0534   CO2 29 11/18/2022 0534   GLUCOSE 107 (H) 11/18/2022 0534   BUN 10 11/18/2022 0534   CREATININE 1.05 (H) 11/18/2022 0534   CALCIUM 9.1 11/18/2022 0534   PROT 7.3 11/15/2022 1247   ALBUMIN 3.4 (L) 11/17/2022 0626   AST 18 11/15/2022 1247   ALT 24 11/15/2022 1247   ALKPHOS 96 11/15/2022 1247   BILITOT 0.5 11/15/2022 1247   GFRNONAA 58 (L) 11/18/2022 0534   GFRAA >60 01/14/2018 0940   Lipase     Component Value Date/Time   LIPASE 15 11/15/2022 1247    Studies/Results: No results found.  Anti-infectives: Anti-infectives (From admission, onward)    Start     Dose/Rate Route Frequency Ordered Stop   11/16/22 0000   piperacillin-tazobactam (ZOSYN) IVPB 3.375 g        3.375 g 12.5 mL/hr over 240 Minutes Intravenous Every 8 hours 11/15/22 1955     11/15/22 1600  piperacillin-tazobactam (ZOSYN) IVPB 3.375 g        3.375 g 12.5 mL/hr over 240 Minutes Intravenous  Once 11/15/22 1554 11/15/22 1900        Assessment/Plan Sigmoid Diverticulitis with intramural abscess - CT 6/17 w/ acute sigmoid colon diverticulitis with 2.4cm intramural abscess. - HDS without fever, tachycardia or hypotension. No peritonitis on exam. WBC wnl. No current indication for emergency surgery - Given fluid collection appears intramural, would not recommended IR drainage - Patient reports last colonoscopy was 2.5 weeks ago with Dr. Marca Ancona that showed diverticulosis with small polyp that was removed. She has not heard back path for this. I do not see results in [facility] Epic. Per Dr. Dulce Sellar this was a small 4mm hyperplastic polyp.  - Okay for d/c from our standpoint. Would complete 14d of abx total (Augmentin). Discussed return precautions with patient on 6/19. We will sign off. Call back with any questions or concerns.    FEN - Soft, IVF per [facility] VTE - SCDs, Lovenox ID - Zosyn  I reviewed nursing notes, last 46  h vitals and pain scores, last 48 h intake and output, last 24 h labs and trends, and last 24 h imaging results.   LOS: 3 days    Jacinto Halim , [facility] 11/18/2022, 8:53 AM Please see Amion for pager number during day hours 7:00am-4:30pm

## 2022-11-18 NOTE — Discharge Summary (Signed)
Physician Discharge Summary   Patient: Lydia Osborne MRN: [id number] DOB: 08-05-1950  Admit date:     11/15/2022  Discharge date: 11/18/22  Discharge Physician: Briant Cedar   PCP: Darrow Bussing, MD   Recommendations at discharge:   Follow-up with PCP in 1 week  Discharge Diagnoses: Principal Problem:   Acute diverticulitis Active Problems:   Anxiety   Hypothyroidism    Hospital Course: 72 year old female with past medical history significant for diverticulosis, diverticulitis, hypothyroidism, anxiety and hyperlipidemia.  Patient presents with 10-day history of lower abdominal pain. CT scan a/p revealed " acute sigmoid colon diverticulitis. 2.4 cm low-density collection along the left wall of the inflamed sigmoid colon, potentially representing developing intramural abscess. No free air".  Patient admitted for further management.    Today, patient was able to tolerate diet, denies any abdominal pain, nausea/vomiting.  Had a BM.  Patient eager to be discharged.  Advised to follow up with PCP in 1 week.    Assessment and Plan:  Acute sigmoid diverticulitis with intramural abscess Afebrile, with no leukocytosis CT abdomen/pelvis as noted above S/p IV Zosyn, discharge on PO Augmentin to complete 14 days of antibiotics Gen surgery consulted, appreciated recs Advised to advance diet slowly, patient verbalized understanding Follow up with PCP in 1 week   Mild AKI S/p IVF Advised patient to stay hydrated due to recent contrast with CT   Anxiety  Continue low-dose Ativan as needed    Hypothyroidism  Continue Synthroid    Obesity Lifestyle modification advised       Pain control - [place] Controlled Substance Reporting System database was reviewed. and patient was instructed, not to drive, operate heavy machinery, perform activities at heights, swimming or participation in water activities or provide baby-sitting services while on Pain, Sleep and  Anxiety Medications; until their outpatient Physician has advised to do so again. Also recommended to not to take more than prescribed Pain, Sleep and Anxiety Medications.    Consultants: General surgery Procedures performed: None Disposition: Home Diet recommendation:  Discharge Diet Orders (From admission, onward)     Start     Ordered   11/18/22 0000  Diet - low sodium heart healthy        11/18/22 0944              DISCHARGE MEDICATION: Allergies as of 11/18/2022       Reactions   Sulfa Antibiotics    Extended exposure causes itchiness        Medication List     TAKE these medications    amoxicillin-clavulanate 875-125 MG tablet Commonly known as: AUGMENTIN Take 1 tablet by mouth 2 (two) times daily for 21 doses.   ezetimibe 10 MG tablet Commonly known as: ZETIA Take 10 mg by mouth daily.   levothyroxine 125 MCG tablet Commonly known as: SYNTHROID Take 125 mcg by mouth daily.   LORazepam 0.5 MG tablet Commonly known as: ATIVAN Take 0.5 mg by mouth daily as needed for anxiety.   MULTIVITAMIN ADULTS 50+ PO Take 1 tablet by mouth daily.        Follow-up Information     Koirala, Dibas, MD. Schedule an appointment as soon as possible for a visit in 1 week(s).   Specialty: Family Medicine Contact information: [address] [place] [address] [place] [address] [phone]                Discharge Exam: Ceasar Mons Weights   11/15/22 1246  Weight: 97.5 kg  General: NAD  Cardiovascular: S1, S2 present Respiratory: CTAB Abdomen: Soft, nontender, nondistended, bowel sounds present Musculoskeletal: No bilateral pedal edema noted Skin: Normal Psychiatry: Normal mood   Condition at discharge: stable  The results of significant diagnostics from this hospitalization (including imaging, microbiology, ancillary and laboratory) are listed below for reference.   Imaging Studies: CT ABDOMEN PELVIS W CONTRAST  Result Date:  11/15/2022 CLINICAL DATA:  Left lower quadrant pain EXAM: CT ABDOMEN AND PELVIS WITH CONTRAST TECHNIQUE: Multidetector CT imaging of the abdomen and pelvis was performed using the standard protocol following bolus administration of intravenous contrast. RADIATION DOSE REDUCTION: This exam was performed according to the departmental dose-optimization program which includes automated exposure control, adjustment of the mA and/or kV according to patient size and/or use of iterative reconstruction technique. CONTRAST:  85mL OMNIPAQUE IOHEXOL 300 MG/ML  SOLN COMPARISON:  Chest CT 05/27/2020 FINDINGS: Lower chest: No acute airspace disease or pleural effusion. Multiple right lung base pulmonary nodules, measuring up to 6 mm, no change and consistent with benign finding, no imaging follow-up is recommended. Hepatobiliary: No focal liver abnormality is seen. No gallstones, gallbladder wall thickening, or biliary dilatation. Pancreas: Unremarkable. No pancreatic ductal dilatation or surrounding inflammatory changes. Spleen: Normal in size without focal abnormality. Adrenals/Urinary Tract: Adrenal glands are unremarkable. Kidneys are normal, without renal calculi, focal lesion, or hydronephrosis. Bladder is unremarkable. Stomach/Bowel: Stomach is within normal limits. Appendix appears normal. Diverticular disease of the colon. Focal wall thickening and inflammation involving the sigmoid colon. No free air. Lower density collection along the left aspect of the inflamed sigmoid colon measuring 2.4 by 1.4 by 2 cm, series 2 image 62, potentially representing developing intramural abscess. Vascular/Lymphatic: Nonaneurysmal aorta. No suspicious lymph nodes. Mild atherosclerosis Reproductive: Status post hysterectomy. No adnexal masses. Other: Negative for pelvic effusion or free air. Musculoskeletal: Bilateral hip replacements with artifact. No acute osseous abnormality. Degenerative changes of the spine with grade 1  anterolisthesis L2 on L3 IMPRESSION: 1. Findings consistent with acute sigmoid colon diverticulitis. 2.4 cm low-density collection along the left wall of the inflamed sigmoid colon, potentially representing developing intramural abscess. No free air. Electronically Signed   By: Jasmine Pang M.D.   On: 11/15/2022 15:31    Microbiology: No results found for this or any previous visit.  Labs: CBC: Recent Labs  Lab 11/15/22 1247 11/16/22 0537 11/17/22 0553 11/18/22 0534  WBC 7.5 4.4 4.4 4.1  NEUTROABS  --   --  2.2  --   HGB 13.1 12.7 13.3 13.8  HCT 39.1 39.3 40.9 42.9  MCV 89.7 91.8 92.1 93.3  PLT 307 280 309 300   Basic Metabolic Panel: Recent Labs  Lab 11/15/22 1247 11/16/22 0537 11/17/22 0553 11/17/22 0626 11/18/22 0534  NA 136 137 137 138 140  K 4.1 4.0 3.9 4.0 4.4  CL 100 104 102 104 105  CO2 27 25 27 27 29   GLUCOSE 96 108* 104* 104* 107*  BUN 13 12 9 9 10   CREATININE 0.92 0.96 1.01* 1.05* 1.05*  CALCIUM 9.6 8.8* 8.9 8.8* 9.1  MG  --  2.3  --   --   --   PHOS  --   --   --  4.2  --    Liver Function Tests: Recent Labs  Lab 11/15/22 1247 11/17/22 0626  AST 18  --   ALT 24  --   ALKPHOS 96  --   BILITOT 0.5  --   PROT 7.3  --   ALBUMIN 4.0  3.4*   CBG: No results for input(s): "GLUCAP" in the last 168 hours.  Discharge time spent: greater than 30 minutes.  Signed: Briant Cedar, MD [facility] 11/18/2022

## 2022-11-25 DIAGNOSIS — M7611 Psoas tendinitis, right hip: Secondary | ICD-10-CM | POA: Diagnosis not present

## 2022-11-25 DIAGNOSIS — R29898 Other symptoms and signs involving the musculoskeletal system: Secondary | ICD-10-CM | POA: Diagnosis not present

## 2022-11-25 DIAGNOSIS — R293 Abnormal posture: Secondary | ICD-10-CM | POA: Diagnosis not present

## 2022-11-26 DIAGNOSIS — K5792 Diverticulitis of intestine, part unspecified, without perforation or abscess without bleeding: Secondary | ICD-10-CM | POA: Diagnosis not present

## 2022-12-01 DIAGNOSIS — R293 Abnormal posture: Secondary | ICD-10-CM | POA: Diagnosis not present

## 2022-12-01 DIAGNOSIS — R29898 Other symptoms and signs involving the musculoskeletal system: Secondary | ICD-10-CM | POA: Diagnosis not present

## 2022-12-01 DIAGNOSIS — M7611 Psoas tendinitis, right hip: Secondary | ICD-10-CM | POA: Diagnosis not present

## 2022-12-10 DIAGNOSIS — R293 Abnormal posture: Secondary | ICD-10-CM | POA: Diagnosis not present

## 2022-12-10 DIAGNOSIS — R29898 Other symptoms and signs involving the musculoskeletal system: Secondary | ICD-10-CM | POA: Diagnosis not present

## 2022-12-10 DIAGNOSIS — M7611 Psoas tendinitis, right hip: Secondary | ICD-10-CM | POA: Diagnosis not present

## 2023-01-12 DIAGNOSIS — R29898 Other symptoms and signs involving the musculoskeletal system: Secondary | ICD-10-CM | POA: Diagnosis not present

## 2023-01-12 DIAGNOSIS — M7611 Psoas tendinitis, right hip: Secondary | ICD-10-CM | POA: Diagnosis not present

## 2023-01-12 DIAGNOSIS — R293 Abnormal posture: Secondary | ICD-10-CM | POA: Diagnosis not present

## 2023-01-21 DIAGNOSIS — M7611 Psoas tendinitis, right hip: Secondary | ICD-10-CM | POA: Diagnosis not present

## 2023-01-21 DIAGNOSIS — R293 Abnormal posture: Secondary | ICD-10-CM | POA: Diagnosis not present

## 2023-01-21 DIAGNOSIS — R29898 Other symptoms and signs involving the musculoskeletal system: Secondary | ICD-10-CM | POA: Diagnosis not present

## 2023-01-26 DIAGNOSIS — M7611 Psoas tendinitis, right hip: Secondary | ICD-10-CM | POA: Diagnosis not present

## 2023-01-26 DIAGNOSIS — R293 Abnormal posture: Secondary | ICD-10-CM | POA: Diagnosis not present

## 2023-01-26 DIAGNOSIS — R29898 Other symptoms and signs involving the musculoskeletal system: Secondary | ICD-10-CM | POA: Diagnosis not present

## 2023-01-27 DIAGNOSIS — H0288B Meibomian gland dysfunction left eye, upper and lower eyelids: Secondary | ICD-10-CM | POA: Diagnosis not present

## 2023-01-27 DIAGNOSIS — L718 Other rosacea: Secondary | ICD-10-CM | POA: Diagnosis not present

## 2023-01-27 DIAGNOSIS — H16223 Keratoconjunctivitis sicca, not specified as Sjogren's, bilateral: Secondary | ICD-10-CM | POA: Diagnosis not present

## 2023-01-27 DIAGNOSIS — H0288A Meibomian gland dysfunction right eye, upper and lower eyelids: Secondary | ICD-10-CM | POA: Diagnosis not present

## 2023-02-24 DIAGNOSIS — R2232 Localized swelling, mass and lump, left upper limb: Secondary | ICD-10-CM | POA: Diagnosis not present

## 2023-02-24 DIAGNOSIS — M67442 Ganglion, left hand: Secondary | ICD-10-CM | POA: Diagnosis not present

## 2023-03-02 ENCOUNTER — Ambulatory Visit: Payer: Medicare Other | Admitting: Obstetrics

## 2023-03-02 ENCOUNTER — Encounter: Payer: Self-pay | Admitting: Obstetrics

## 2023-03-02 VITALS — BP 122/72 | Ht 67.0 in | Wt 206.0 lb

## 2023-03-02 DIAGNOSIS — R350 Frequency of micturition: Secondary | ICD-10-CM

## 2023-03-02 DIAGNOSIS — Z87440 Personal history of urinary (tract) infections: Secondary | ICD-10-CM | POA: Diagnosis not present

## 2023-03-02 DIAGNOSIS — N905 Atrophy of vulva: Secondary | ICD-10-CM | POA: Diagnosis not present

## 2023-03-02 LAB — POCT URINALYSIS DIPSTICK
Bilirubin, UA: NEGATIVE
Glucose, UA: NEGATIVE
Ketones, UA: NEGATIVE
Nitrite, UA: NEGATIVE
Protein, UA: NEGATIVE
Spec Grav, UA: 1.01 (ref 1.010–1.025)
Urobilinogen, UA: 1 U/dL
pH, UA: 6 (ref 5.0–8.0)

## 2023-03-02 MED ORDER — NITROFURANTOIN MONOHYD MACRO 100 MG PO CAPS
100.0000 mg | ORAL_CAPSULE | Freq: Two times a day (BID) | ORAL | 0 refills | Status: AC
Start: 2023-03-02 — End: ?

## 2023-03-02 NOTE — Progress Notes (Signed)
    OFFICE VISIT NOTE  Subjective:    Patient ID: Lydia Osborne, female    DOB: [birth year], 72 y.o.   MRN: [id number]       HPI  Patient is a 72 y.o. [id number] female who presents for urinary frequency and genital irritation for 7 days.  Patient denies dysuria, hematuria, flank pain, abdominal pain, and vaginal discharge.  Patient does have a history of recurrent UTIs.  Patient does not have a history of pyelonephritis.    Objective:    BP 122/72   Ht 5\' 7"  (1.702 m)   Wt 206 lb (93.4 kg)   BMI 32.26 kg/m    Lab Review  Results for orders placed or performed in visit on 03/02/23  POCT urinalysis dipstick  Result Value Ref Range   Color, UA     Clarity, UA     Glucose, UA Negative Negative   Bilirubin, UA Negative    Ketones, UA Negative    Spec Grav, UA 1.010 1.010 - 1.025   Blood, UA trace    pH, UA 6.0 5.0 - 8.0   Protein, UA Negative Negative   Urobilinogen, UA 1.0 0.2 or 1.0 E.U./dL   Nitrite, UA Negative    Leukocytes, UA Trace (A) Negative   Appearance     Odor     PHYSICAL EXAM: GEN: A&O, NAD RESP: NWOB ABD: soft, NT GU: moderate vulvar atrophy, no vulvovaginal erythema, edema or lesions.  EXT: normal ROM x 4  Assessment:   1. Urine frequency   2. Vulvar atrophy      Plan:   [id number] with hx of recurrent UTIs, here with symptoms consistent with UTI and UA in office with trace BLD and LE. Discussed waiting for culture results or starting empiric treatment and pt agrees to empiric treatment. Also discussed topical Estradiol to help with the recurrent UTIs, pt declines at this time. -Urine Culture Sent. Treatment  Macrobid 100 mg PO BID for 7 days. -Check MyChart for culture results, may stop abx if no UTI.  -Follow up as needed.   Julieanne Manson, DO [facility]

## 2023-03-05 LAB — URINE CULTURE

## 2023-03-07 NOTE — Progress Notes (Signed)
Please call pt and let her know the culture confirmed UTI and the abx we prescribed should be taking care of it.

## 2023-03-07 NOTE — Telephone Encounter (Signed)
Spoke with patient, she is aware to continue medication previously prescribed by Dr. Lonny Prude

## 2023-03-16 DIAGNOSIS — H16223 Keratoconjunctivitis sicca, not specified as Sjogren's, bilateral: Secondary | ICD-10-CM | POA: Diagnosis not present

## 2023-03-16 DIAGNOSIS — H0222C Mechanical lagophthalmos, bilateral, upper and lower eyelids: Secondary | ICD-10-CM | POA: Diagnosis not present

## 2023-03-16 DIAGNOSIS — H0288B Meibomian gland dysfunction left eye, upper and lower eyelids: Secondary | ICD-10-CM | POA: Diagnosis not present

## 2023-03-16 DIAGNOSIS — L718 Other rosacea: Secondary | ICD-10-CM | POA: Diagnosis not present

## 2023-03-16 DIAGNOSIS — H0288A Meibomian gland dysfunction right eye, upper and lower eyelids: Secondary | ICD-10-CM | POA: Diagnosis not present

## 2023-03-17 DIAGNOSIS — M25742 Osteophyte, left hand: Secondary | ICD-10-CM | POA: Diagnosis not present

## 2023-03-17 DIAGNOSIS — M67442 Ganglion, left hand: Secondary | ICD-10-CM | POA: Diagnosis not present

## 2023-03-28 DIAGNOSIS — M67442 Ganglion, left hand: Secondary | ICD-10-CM | POA: Diagnosis not present

## 2023-04-19 DIAGNOSIS — R7301 Impaired fasting glucose: Secondary | ICD-10-CM | POA: Diagnosis not present

## 2023-04-19 DIAGNOSIS — E7800 Pure hypercholesterolemia, unspecified: Secondary | ICD-10-CM | POA: Diagnosis not present

## 2023-04-19 DIAGNOSIS — Z79899 Other long term (current) drug therapy: Secondary | ICD-10-CM | POA: Diagnosis not present

## 2023-04-19 DIAGNOSIS — E039 Hypothyroidism, unspecified: Secondary | ICD-10-CM | POA: Diagnosis not present

## 2023-04-21 DIAGNOSIS — R7301 Impaired fasting glucose: Secondary | ICD-10-CM | POA: Diagnosis not present

## 2023-04-21 DIAGNOSIS — Z0001 Encounter for general adult medical examination with abnormal findings: Secondary | ICD-10-CM | POA: Diagnosis not present

## 2023-04-21 DIAGNOSIS — E039 Hypothyroidism, unspecified: Secondary | ICD-10-CM | POA: Diagnosis not present

## 2023-04-21 DIAGNOSIS — F419 Anxiety disorder, unspecified: Secondary | ICD-10-CM | POA: Diagnosis not present

## 2023-05-12 DIAGNOSIS — H0288A Meibomian gland dysfunction right eye, upper and lower eyelids: Secondary | ICD-10-CM | POA: Diagnosis not present

## 2023-05-12 DIAGNOSIS — H16223 Keratoconjunctivitis sicca, not specified as Sjogren's, bilateral: Secondary | ICD-10-CM | POA: Diagnosis not present

## 2023-05-12 DIAGNOSIS — L718 Other rosacea: Secondary | ICD-10-CM | POA: Diagnosis not present

## 2023-05-12 DIAGNOSIS — H04201 Unspecified epiphora, right lacrimal gland: Secondary | ICD-10-CM | POA: Diagnosis not present

## 2023-05-12 DIAGNOSIS — H0288B Meibomian gland dysfunction left eye, upper and lower eyelids: Secondary | ICD-10-CM | POA: Diagnosis not present

## 2023-06-20 DIAGNOSIS — R29898 Other symptoms and signs involving the musculoskeletal system: Secondary | ICD-10-CM | POA: Diagnosis not present

## 2023-06-20 DIAGNOSIS — M542 Cervicalgia: Secondary | ICD-10-CM | POA: Diagnosis not present

## 2023-06-20 DIAGNOSIS — M531 Cervicobrachial syndrome: Secondary | ICD-10-CM | POA: Diagnosis not present

## 2023-07-08 DIAGNOSIS — R29898 Other symptoms and signs involving the musculoskeletal system: Secondary | ICD-10-CM | POA: Diagnosis not present

## 2023-07-08 DIAGNOSIS — M531 Cervicobrachial syndrome: Secondary | ICD-10-CM | POA: Diagnosis not present

## 2023-07-08 DIAGNOSIS — M542 Cervicalgia: Secondary | ICD-10-CM | POA: Diagnosis not present

## 2023-07-26 DIAGNOSIS — L718 Other rosacea: Secondary | ICD-10-CM | POA: Diagnosis not present

## 2023-07-26 DIAGNOSIS — H16223 Keratoconjunctivitis sicca, not specified as Sjogren's, bilateral: Secondary | ICD-10-CM | POA: Diagnosis not present

## 2023-07-26 DIAGNOSIS — H0288A Meibomian gland dysfunction right eye, upper and lower eyelids: Secondary | ICD-10-CM | POA: Diagnosis not present

## 2023-07-26 DIAGNOSIS — H0288B Meibomian gland dysfunction left eye, upper and lower eyelids: Secondary | ICD-10-CM | POA: Diagnosis not present

## 2023-09-26 ENCOUNTER — Other Ambulatory Visit: Payer: Self-pay | Admitting: Family Medicine

## 2023-09-26 DIAGNOSIS — Z1231 Encounter for screening mammogram for malignant neoplasm of breast: Secondary | ICD-10-CM

## 2023-09-27 DIAGNOSIS — L718 Other rosacea: Secondary | ICD-10-CM | POA: Diagnosis not present

## 2023-09-27 DIAGNOSIS — H0288B Meibomian gland dysfunction left eye, upper and lower eyelids: Secondary | ICD-10-CM | POA: Diagnosis not present

## 2023-09-27 DIAGNOSIS — H43813 Vitreous degeneration, bilateral: Secondary | ICD-10-CM | POA: Diagnosis not present

## 2023-09-27 DIAGNOSIS — H0288A Meibomian gland dysfunction right eye, upper and lower eyelids: Secondary | ICD-10-CM | POA: Diagnosis not present

## 2023-09-27 DIAGNOSIS — H2513 Age-related nuclear cataract, bilateral: Secondary | ICD-10-CM | POA: Diagnosis not present

## 2023-09-27 DIAGNOSIS — H524 Presbyopia: Secondary | ICD-10-CM | POA: Diagnosis not present

## 2023-09-27 DIAGNOSIS — H16223 Keratoconjunctivitis sicca, not specified as Sjogren's, bilateral: Secondary | ICD-10-CM | POA: Diagnosis not present

## 2023-10-10 ENCOUNTER — Ambulatory Visit
Admission: RE | Admit: 2023-10-10 | Discharge: 2023-10-10 | Disposition: A | Discharge status: Home / Self Care | Source: Ambulatory Visit | Attending: Family Medicine | Admitting: Family Medicine

## 2023-10-10 DIAGNOSIS — Z1231 Encounter for screening mammogram for malignant neoplasm of breast: Secondary | ICD-10-CM | POA: Insufficient documentation

## 2023-10-14 DIAGNOSIS — L853 Xerosis cutis: Secondary | ICD-10-CM | POA: Diagnosis not present

## 2023-10-14 DIAGNOSIS — L821 Other seborrheic keratosis: Secondary | ICD-10-CM | POA: Diagnosis not present

## 2023-10-14 DIAGNOSIS — L814 Other melanin hyperpigmentation: Secondary | ICD-10-CM | POA: Diagnosis not present

## 2023-10-14 DIAGNOSIS — D225 Melanocytic nevi of trunk: Secondary | ICD-10-CM | POA: Diagnosis not present

## 2023-11-09 DIAGNOSIS — M25512 Pain in left shoulder: Secondary | ICD-10-CM | POA: Diagnosis not present

## 2023-11-09 DIAGNOSIS — S46112D Strain of muscle, fascia and tendon of long head of biceps, left arm, subsequent encounter: Secondary | ICD-10-CM | POA: Diagnosis not present
